# Patient Record
Sex: Female | Born: 2000 | Race: White | Hispanic: No | Smoking: Never smoker
Health system: Southern US, Community
[De-identification: ages and names within clinical notes are randomized; demographics above are authoritative.]

## PROBLEM LIST (undated history)

## (undated) DIAGNOSIS — J45909 Unspecified asthma, uncomplicated: Secondary | ICD-10-CM

## (undated) DIAGNOSIS — T7840XA Allergy, unspecified, initial encounter: Secondary | ICD-10-CM

## (undated) DIAGNOSIS — L309 Dermatitis, unspecified: Secondary | ICD-10-CM

## (undated) DIAGNOSIS — L709 Acne, unspecified: Secondary | ICD-10-CM

## (undated) HISTORY — DX: Unspecified asthma, uncomplicated: J45.909

## (undated) HISTORY — DX: Allergy, unspecified, initial encounter: T78.40XA

## (undated) HISTORY — DX: Dermatitis, unspecified: L30.9

---
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
## 2022-01-03 DIAGNOSIS — H5213 Myopia, bilateral: Secondary | ICD-10-CM | POA: Diagnosis not present

## 2022-01-12 ENCOUNTER — Encounter: Payer: Self-pay | Admitting: Women's Health

## 2022-01-18 ENCOUNTER — Ambulatory Visit (INDEPENDENT_AMBULATORY_CARE_PROVIDER_SITE_OTHER): Payer: Medicaid Other | Admitting: Women's Health

## 2022-01-18 ENCOUNTER — Encounter: Payer: Self-pay | Admitting: Women's Health

## 2022-01-18 VITALS — BP 116/80 | HR 103 | Wt 117.0 lb

## 2022-01-18 DIAGNOSIS — Z3041 Encounter for surveillance of contraceptive pills: Secondary | ICD-10-CM | POA: Diagnosis not present

## 2022-01-18 NOTE — Progress Notes (Signed)
   GYN VISIT Patient name: Andrea Kidd MRN [id number]  Date of birth: 03-02-2001 Chief Complaint:   Follow-up  History of Present Illness:   Andrea Kidd is a 21 y.o. G0P0000 female being seen today for f/u on nextstellis rx'd 3/20 for irregular periods. Doing well, had a week of brown spotting in May. No other issues.     Patient's last menstrual period was 12/06/2021 (exact date). The current method of family planning is OCP (estrogen/progesterone).  Last pap <21yo. Results were: N/A     10/18/2021    3:09 PM 09/28/2021    1:40 PM 09/15/2021   11:28 AM 07/12/2021   11:24 AM  Depression screen PHQ 2/9  Decreased Interest 0 0 0 0  Down, Depressed, Hopeless 0 0 0 0  PHQ - 2 Score 0 0 0 0  Altered sleeping 0   0  Tired, decreased energy 0   0  Change in appetite 0   0  Feeling bad or failure about yourself  0   0  Trouble concentrating 0   0  Moving slowly or fidgety/restless 0   0  Suicidal thoughts 0   0  PHQ-9 Score 0   0        10/18/2021    3:09 PM  GAD 7 : Generalized Anxiety Score  Nervous, Anxious, on Edge 0  Control/stop worrying 0  Worry too much - different things 0  Trouble relaxing 0  Restless 0  Easily annoyed or irritable 0  Afraid - awful might happen 0  Total GAD 7 Score 0     Review of Systems:   Pertinent items are noted in HPI Denies fever/chills, dizziness, headaches, visual disturbances, fatigue, shortness of breath, chest pain, abdominal pain, vomiting, abnormal vaginal discharge/itching/odor/irritation, problems with periods, bowel movements, urination, or intercourse unless otherwise stated above.  Pertinent History Reviewed:  Reviewed past medical,surgical, social, obstetrical and family history.  Reviewed problem list, medications and allergies. Physical Assessment:   Vitals:   01/18/22 0902  BP: 116/80  Pulse: (!) 103  Weight: 117 lb (53.1 kg)  Body mass index is 20.73 kg/m.       Physical Examination:   General  appearance: alert, well appearing, and in no distress  Mental status: alert, oriented to person, place, and time  Skin: warm & dry   Cardiovascular: normal heart rate noted  Respiratory: normal respiratory effort, no distress  Abdomen: soft, non-tender   Pelvic: examination not indicated  Extremities: no edema   Chaperone: N/A    No results found for this or any previous visit (from the past 24 hour(s)).  Assessment & Plan:  1) Contraception surveillance> doing well on Nextstellis, f/u in March to refill  Meds: No orders of the defined types were placed in this encounter.   No orders of the defined types were placed in this encounter.   Return for after 3/20 for med f/u.  Cheral Marker CNM, [facility] 01/18/2022 9:39 AM

## 2022-02-09 ENCOUNTER — Other Ambulatory Visit: Payer: Self-pay | Admitting: Internal Medicine

## 2022-02-09 DIAGNOSIS — M549 Dorsalgia, unspecified: Secondary | ICD-10-CM

## 2022-02-09 MED ORDER — IBUPROFEN 600 MG PO TABS: 600.0000 mg | ORAL_TABLET | Freq: Three times a day (TID) | ORAL | 0 refills | Status: DC | PRN

## 2022-03-14 ENCOUNTER — Ambulatory Visit
Admission: RE | Admit: 2022-03-14 | Discharge: 2022-03-14 | Disposition: A | Discharge status: Home / Self Care | Payer: Medicaid Other | Source: Ambulatory Visit | Attending: Nurse Practitioner | Admitting: Nurse Practitioner

## 2022-03-14 VITALS — BP 108/72 | HR 88 | Temp 98.8°F | Resp 19

## 2022-03-14 DIAGNOSIS — J309 Allergic rhinitis, unspecified: Secondary | ICD-10-CM | POA: Diagnosis not present

## 2022-03-14 MED ORDER — CETIRIZINE HCL 10 MG PO TABS: 10.0000 mg | ORAL_TABLET | Freq: Every day | ORAL | 0 refills | Status: DC

## 2022-03-14 MED ORDER — PSEUDOEPH-BROMPHEN-DM 30-2-10 MG/5ML PO SYRP: 5.0000 mL | ORAL_SOLUTION | Freq: Four times a day (QID) | ORAL | 0 refills | Status: DC | PRN

## 2022-03-14 NOTE — ED Provider Notes (Signed)
[facility]    CSN: [id number] Arrival date & time: 03/14/22  1415      History   Chief Complaint Chief Complaint  Patient presents with   Nasal Congestion    Running a low grade fever, very congested - Entered by patient   Appointment    HPI CLEOPATRA PINET is a 21 y.o. female.   The history is provided by the patient.   Patient presents for complaints of head congestion, nasal congestion, and cough that is been present for the past 2 days.  Patient states earlier this morning, she was running a fever of 101.  Patient states that she has not had chills, sore throat, headache, sinus pressure, wheezing, shortness of breath, or GI symptoms.  Patient states that she does have a history of seasonal allergies, takes levocetirizine.  She states that she does not feel that this medication is helping her allergy symptoms.  States that she does have Flonase at home, but does not use it regularly.  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  States there was a child that was sick last week with an ear infection.  States that she took Sudafed earlier this morning for her symptoms, feels symptoms have improved since taking the medication.  Declines COVID/flu testing today.  Past Medical History:  Diagnosis Date   Acne    Eczema 01/28/2013    Patient Active Problem List   Diagnosis Date Noted   Intermenstrual bleeding 09/15/2021   Menorrhagia with regular cycle 07/12/2021   Mild intermittent asthma, uncomplicated [id number]   Eczema 01/28/2013   Allergic rhinitis 01/28/2013    History reviewed. No pertinent surgical history.  OB History     Gravida  0   Para  0   Term  0   Preterm  0   AB  0   Living  0      SAB  0   IAB  0   Ectopic  0   Multiple  0   Live Births  0            Home Medications    Prior to Admission medications   Medication Sig Start Date End Date Taking? Authorizing Provider  cyclobenzaprine (FLEXERIL) 5 MG tablet  Take 1 tablet (5 mg total) by mouth 3 (three) times daily as needed for muscle spasms. 09/28/21   Lindell Spar, MD  Drospirenone-Estetrol (NEXTSTELLIS) 3-14.2 MG TABS Take 1 tablet by mouth daily. 10/18/21   Roma Schanz, CNM  ibuprofen (ADVIL) 600 MG tablet Take 1 tablet (600 mg total) by mouth every 8 (eight) hours as needed. 02/09/22   Lindell Spar, MD  levocetirizine (XYZAL) 5 MG tablet Take 1 tablet (5 mg total) by mouth every evening. 07/12/21   Lindell Spar, MD    Family History Family History  Problem Relation Age of Onset   Heart attack Maternal Grandfather    Healthy Father    Healthy Mother    Asthma Brother     Social History Social History   Tobacco Use   Smoking status: Never   Smokeless tobacco: Never  Vaping Use   Vaping Use: Never used  Substance Use Topics   Alcohol use: No   Drug use: No     Allergies   Bee pollen   Review of Systems Review of Systems Per HPI  Physical Exam Triage Vital Signs ED Triage Vitals  Enc Vitals Group     BP 03/14/22  1429 108/72     Pulse Rate 03/14/22 1429 88     Resp 03/14/22 1429 19     Temp 03/14/22 1429 98.8 F (37.1 C)     Temp src --      SpO2 03/14/22 1429 97 %     Weight --      Height --      Head Circumference --      Peak Flow --      Pain Score 03/14/22 1428 1     Pain Loc --      Pain Edu? --      Excl. in GC? --    No data found.  Updated Vital Signs BP 108/72   Pulse 88   Temp 98.8 F (37.1 C)   Resp 19   LMP 03/08/2022   SpO2 97%   Visual Acuity Right Eye Distance:   Left Eye Distance:   Bilateral Distance:    Right Eye Near:   Left Eye Near:    Bilateral Near:     Physical Exam Vitals and nursing note reviewed.  Constitutional:      General: She is not in acute distress.    Appearance: Normal appearance.  HENT:     Head: Normocephalic.     Right Ear: Tympanic membrane, ear canal and external ear normal.     Left Ear: Tympanic membrane, ear canal and external  ear normal.     Nose: Congestion present.     Right Turbinates: Enlarged and swollen.     Left Turbinates: Enlarged and swollen.     Right Sinus: No maxillary sinus tenderness or frontal sinus tenderness.     Left Sinus: No maxillary sinus tenderness or frontal sinus tenderness.     Mouth/Throat:     Lips: Pink.     Mouth: Mucous membranes are moist.     Pharynx: Oropharynx is clear. Uvula midline. No pharyngeal swelling, oropharyngeal exudate, posterior oropharyngeal erythema or uvula swelling.     Tonsils: No tonsillar exudate.  Eyes:     Extraocular Movements: Extraocular movements intact.     Conjunctiva/sclera: Conjunctivae normal.     Pupils: Pupils are equal, round, and reactive to light.  Cardiovascular:     Rate and Rhythm: Normal rate and regular rhythm.     Pulses: Normal pulses.     Heart sounds: Normal heart sounds.  Pulmonary:     Effort: Pulmonary effort is normal. No respiratory distress.     Breath sounds: Normal breath sounds. No wheezing or rales.  Abdominal:     General: Bowel sounds are normal.     Palpations: Abdomen is soft.  Musculoskeletal:     Cervical back: Normal range of motion.  Lymphadenopathy:     Cervical: No cervical adenopathy.  Skin:    General: Skin is warm and dry.  Neurological:     General: No focal deficit present.     Mental Status: She is alert and oriented to person, place, and time.  Psychiatric:        Mood and Affect: Mood normal.        Behavior: Behavior normal.      UC Treatments / Results  Labs (all labs ordered are listed, but only abnormal results are displayed) Labs Reviewed - No data to display  EKG   Radiology No results found.  Procedures Procedures (including critical care time)  Medications Ordered in UC Medications - No data to display  Initial Impression / Assessment and Plan /  UC Course  I have reviewed the triage vital signs and the nursing notes.  Pertinent labs & imaging results that were  available during my care of the patient were reviewed by me and considered in my medical decision making (see chart for details).  Patient presents with upper respiratory symptoms that been present for the past 2 days.  Patient's vital signs are stable, exam is reassuring at this time.  Differential diagnoses include allergic rhinitis versus viral upper respiratory infection.  Patient's symptoms have improved since taking Sudafed.  Will provide patient additional symptomatic relief with Bromfed and Zyrtec.  Recommended continuing use of Flonase.  Supportive care recommendations were provided to the patient.  Patient declines COVID/flu testing today.  Patient advised to follow-up in 7 to 10 days if symptoms do not improve. Final Clinical Impressions(s) / UC Diagnoses   Final diagnoses:  None   Discharge Instructions   None    ED Prescriptions   None    PDMP not reviewed this encounter.   Abran Cantor, NP 03/14/22 1446

## 2022-03-14 NOTE — ED Triage Notes (Signed)
Pt presents with complaints of head and nasal congestion x 2 days. Fever of 101. Pt has some relief with otc medication.

## 2022-03-14 NOTE — Discharge Instructions (Signed)
Take medication as prescribed.  Resume use of Flonase as discussed. Increase fluids and allow for plenty of rest. Recommend using a humidifier at bedtime during sleep to help with cough and nasal congestion. Sleep elevated on 2 pillows for worsening cough and nasal congestion.. Follow-up in 7 to 10 days if your symptoms do not improve.

## 2022-03-15 ENCOUNTER — Encounter: Payer: Self-pay | Admitting: Women's Health

## 2022-03-30 ENCOUNTER — Encounter: Payer: Self-pay | Admitting: Internal Medicine

## 2022-03-30 ENCOUNTER — Ambulatory Visit (INDEPENDENT_AMBULATORY_CARE_PROVIDER_SITE_OTHER): Payer: Medicaid Other | Admitting: Internal Medicine

## 2022-03-30 VITALS — BP 108/64 | HR 76 | Resp 18 | Ht 63.0 in | Wt 117.6 lb

## 2022-03-30 DIAGNOSIS — M2142 Flat foot [pes planus] (acquired), left foot: Secondary | ICD-10-CM | POA: Diagnosis not present

## 2022-03-30 DIAGNOSIS — M2141 Flat foot [pes planus] (acquired), right foot: Secondary | ICD-10-CM

## 2022-03-30 DIAGNOSIS — Z23 Encounter for immunization: Secondary | ICD-10-CM | POA: Diagnosis not present

## 2022-03-30 DIAGNOSIS — M545 Low back pain, unspecified: Secondary | ICD-10-CM | POA: Insufficient documentation

## 2022-03-30 DIAGNOSIS — G8929 Other chronic pain: Secondary | ICD-10-CM | POA: Diagnosis not present

## 2022-03-30 DIAGNOSIS — M2140 Flat foot [pes planus] (acquired), unspecified foot: Secondary | ICD-10-CM | POA: Insufficient documentation

## 2022-03-30 MED ORDER — IBUPROFEN 600 MG PO TABS: 600.0000 mg | ORAL_TABLET | Freq: Three times a day (TID) | ORAL | 0 refills | Status: DC | PRN

## 2022-03-30 NOTE — Patient Instructions (Signed)
Please take Ibuprofen as needed for back pain.  Please avoid heavy lifting and frequent bending.

## 2022-03-30 NOTE — Progress Notes (Signed)
Acute Office Visit  Subjective:    Patient ID: Andrea Kidd, female    DOB: 22-Jun-2001, 21 y.o.   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  She denies any recent fall or injury.  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  She denies any numbness or tingling of the LE.  She was told of flatfeet by a podiatrist, and she has tried different shoes to help with flatfeet.  She reports mild improvement in her back pain with those shoes.  She requests a referral to Podiatry.   Past Medical History:  Diagnosis Date   Acne    Eczema 01/28/2013    History reviewed. No pertinent surgical history.  Family History  Problem Relation Age of Onset   Heart attack Maternal Grandfather    Healthy Father    Healthy Mother    Asthma Brother     Social History   Socioeconomic History   Marital status: Single    Spouse name: Not on file   Number of children: Not on file   Years of education: Not on file   Highest education level: Not on file  Occupational History   Not on file  Tobacco Use   Smoking status: Never   Smokeless tobacco: Never  Vaping Use   Vaping Use: Never used  Substance and Sexual Activity   Alcohol use: No   Drug use: No   Sexual activity: Yes    Birth control/protection: Condom  Other Topics Concern   Not on file  Social History Narrative   Lives with Mom, Dad and older sister and younger brother. No smokers in the house.    Social Determinants of Health   Financial Resource Strain: Low Risk  (10/18/2021)   Overall Financial  Resource Strain (CARDIA)    Difficulty of Paying Living Expenses: Not hard at all  Food Insecurity: No Food Insecurity (10/18/2021)   Hunger Vital Sign    Worried About Running Out of Food in the Last Year: Never true    Ran Out of Food in the Last Year: Never true  Transportation Needs: No Transportation Needs (10/18/2021)   PRAPARE - Administrator, Civil Service (Medical): No    Lack of Transportation (Non-Medical): No  Physical Activity: Insufficiently Active (10/18/2021)   Exercise Vital Sign    Days of Exercise per Week: 1 day    Minutes of Exercise per Session: 30 min  Stress: No Stress Concern Present (10/18/2021)   [employer] of Occupational Health - Occupational Stress Questionnaire    Feeling of Stress : Only a little  Social Connections: Moderately Isolated (10/18/2021)   Social Connection and Isolation Panel [NHANES]    Frequency of Communication with Friends and Family: More than three times a week    Frequency of Social Gatherings with Friends and Family: More than three times a week    Attends Religious Services: Never    Database administrator or Organizations: No    Attends [employer]  or Organization Meetings: Never    Marital Status: Living with partner  Intimate Partner Violence: Not At Risk (10/18/2021)   Humiliation, Afraid, Rape, and Kick questionnaire    Fear of Current or Ex-Partner: No    Emotionally Abused: No    Physically Abused: No    Sexually Abused: No    Outpatient Medications Prior to Visit  Medication Sig Dispense Refill   cetirizine (ZYRTEC) 10 MG tablet Take 1 tablet (10 mg total) by mouth daily. 30 tablet 0   Drospirenone-Estetrol (NEXTSTELLIS) 3-14.2 MG TABS Take 1 tablet by mouth daily. 90 tablet 3   levocetirizine (XYZAL) 5 MG tablet Take 1 tablet (5 mg total) by mouth every evening. 90 tablet 1   ibuprofen (ADVIL) 600 MG tablet Take 1 tablet (600 mg total) by mouth every 8 (eight) hours as needed. 30 tablet 0    brompheniramine-pseudoephedrine-DM 30-2-10 MG/5ML syrup Take 5 mLs by mouth 4 (four) times daily as needed. (Patient not taking: Reported on 03/30/2022) 140 mL 0   cyclobenzaprine (FLEXERIL) 5 MG tablet Take 1 tablet (5 mg total) by mouth 3 (three) times daily as needed for muscle spasms. (Patient not taking: Reported on 03/30/2022) 30 tablet 1   No facility-administered medications prior to visit.    Allergies  Allergen Reactions   Bee Pollen     Review of Systems  Constitutional:  Negative for chills and fever.  HENT:  Negative for postnasal drip, sinus pressure and sore throat.   Respiratory:  Negative for cough and shortness of breath.   Cardiovascular:  Negative for chest pain and palpitations.  Genitourinary:  Positive for menstrual problem. Negative for dysuria and hematuria.  Musculoskeletal:  Positive for back pain. Negative for neck pain and neck stiffness.  Skin:  Negative for rash.  Neurological:  Negative for dizziness and weakness.  Psychiatric/Behavioral:  Negative for agitation and behavioral problems.        Objective:    Physical Exam Vitals reviewed.  Constitutional:      General: She is not in acute distress.    Appearance: She is not diaphoretic.  HENT:     Head: Normocephalic and atraumatic.     Nose: Nose normal.     Mouth/Throat:     Mouth: Mucous membranes are moist.     Pharynx: No oropharyngeal exudate.  Eyes:     General: No scleral icterus.    Extraocular Movements: Extraocular movements intact.  Cardiovascular:     Rate and Rhythm: Normal rate and regular rhythm.     Heart sounds: Normal heart sounds. No murmur heard. Pulmonary:     Breath sounds: Normal breath sounds. No wheezing or rales.  Musculoskeletal:        General: Tenderness (Mild, lumbar spine area) present.     Cervical back: Neck supple. No tenderness.     Right lower leg: No edema.     Left lower leg: No edema.  Skin:    General: Skin is warm.     Findings: No rash.   Neurological:     General: No focal deficit present.     Mental Status: She is alert and oriented to person, place, and time.     Sensory: No sensory deficit.     Motor: No weakness.  Psychiatric:        Mood and Affect: Mood normal.        Behavior: Behavior normal.     BP 108/64 (BP Location: Right Arm, Patient Position: Sitting, Cuff Size: Normal)  Pulse 76   Resp 18   Ht 5\' 3"  (1.6 m)   Wt 117 lb 9.6 oz (53.3 kg)   LMP 03/08/2022   SpO2 99%   BMI 20.83 kg/m  Wt Readings from Last 3 Encounters:  03/30/22 117 lb 9.6 oz (53.3 kg) (32 %, Z= -0.47)*  01/18/22 117 lb (53.1 kg) (31 %, Z= -0.48)*  10/18/21 119 lb (54 kg) (37 %, Z= -0.34)*   * Growth percentiles are based on CDC (Girls, 2-20 Years) data.        Assessment & Plan:   Problem List Items Addressed This Visit       Other   Chronic bilateral low back pain without sciatica - Primary    Unclear etiology Avoid heavy lifting and frequent bending Ibuprofen as needed for mild to moderate back pain Check x-ray of lumbar spine Referred to PT Simple back exercises material provided      Relevant Medications   ibuprofen (ADVIL) 600 MG tablet   Other Relevant Orders   Ambulatory referral to Physical Therapy   DG Lumbar Spine Complete   Flat foot    Has slightly lower curvature of feet Referred to Podiatry      Relevant Orders   Ambulatory referral to Podiatry   Other Visit Diagnoses     Need for immunization against influenza       Relevant Orders   Flu Vaccine QUAD 47mo+IM (Fluarix, Fluzone & Alfiuria Quad PF) (Completed)        Meds ordered this encounter  Medications   ibuprofen (ADVIL) 600 MG tablet    Sig: Take 1 tablet (600 mg total) by mouth every 8 (eight) hours as needed.    Dispense:  30 tablet    Refill:  0     Lolita Faulds 5mo, MD

## 2022-03-30 NOTE — Assessment & Plan Note (Signed)
Unclear etiology Avoid heavy lifting and frequent bending Ibuprofen as needed for mild to moderate back pain Check x-ray of lumbar spine Referred to PT Simple back exercises material provided

## 2022-03-30 NOTE — Assessment & Plan Note (Signed)
Has slightly lower curvature of feet Referred to Podiatry

## 2022-05-03 ENCOUNTER — Ambulatory Visit (HOSPITAL_COMMUNITY): Payer: Medicaid Other

## 2022-05-03 ENCOUNTER — Ambulatory Visit (INDEPENDENT_AMBULATORY_CARE_PROVIDER_SITE_OTHER): Payer: Medicaid Other | Admitting: Podiatry

## 2022-05-03 ENCOUNTER — Encounter: Payer: Self-pay | Admitting: Podiatry

## 2022-05-03 ENCOUNTER — Ambulatory Visit: Payer: Medicaid Other

## 2022-05-03 DIAGNOSIS — M2141 Flat foot [pes planus] (acquired), right foot: Secondary | ICD-10-CM

## 2022-05-03 DIAGNOSIS — M76821 Posterior tibial tendinitis, right leg: Secondary | ICD-10-CM | POA: Diagnosis not present

## 2022-05-03 DIAGNOSIS — M21169 Varus deformity, not elsewhere classified, unspecified knee: Secondary | ICD-10-CM

## 2022-05-03 DIAGNOSIS — M2142 Flat foot [pes planus] (acquired), left foot: Secondary | ICD-10-CM | POA: Diagnosis not present

## 2022-05-03 NOTE — Patient Instructions (Signed)
Posterior Tibial Tendinitis  Posterior tibial tendinitis is irritation of a tendon called the posterior tibial tendon. Your posterior tibial tendon is a cord-like tissue that connects bones of your lower leg and foot to a muscle that: Supports your arch. Helps you raise up on your toes. Helps you turn your foot down and in. This condition causes foot and ankle pain. It can also lead to a flat foot. What are the causes? This condition is most often caused by repeated stress to the tendon (overuse injury). It can also be caused by a sudden injury that stresses the tendon, such as landing on your foot after jumping or falling. What increases the risk? This condition is more likely to develop in: People who play a sport that involves putting a lot of pressure on the feet, such as: Basketball. Tennis. Soccer. Hockey. Runners. Females who are older than 19 years of age and are overweight. People with diabetes. People with decreased foot stability. People with flat feet. What are the signs or symptoms? Symptoms include: Pain in the inner ankle. Pain at the arch of your foot. Pain that gets worse with running, walking, or standing. Swelling on the inside of your ankle and foot. Weakness in your ankle or foot. Inability to stand up on tiptoe. Flattening of the arch of your foot. How is this diagnosed? This condition may be diagnosed based on: Your symptoms. Your medical history. A physical exam. Tests, such as: X-ray. MRI. Ultrasound. How is this treated? This condition may be treated by: Putting ice to the injured area. Taking NSAIDs, such as ibuprofen, to reduce pain and swelling. Wearing a special shoe or shoe insert to support your arch (orthotic). Having physical therapy. Replacing high-impact exercise with low-impact exercise, such as swimming or cycling. If your symptoms do not improve with these treatments, you may need to wear a splint, removable walking boot, or short  leg cast for 6-8 weeks to keep your foot and ankle still (immobilized). Follow these instructions at home: If you have a cast, splint, or boot: Keep it clean and dry. Check the skin around it every day. Tell your health care provider about any concerns. If you have a cast: Do not stick anything inside it to scratch your skin. Doing that increases your risk of infection. You may put lotion on dry skin around the edges of the cast. Do not put lotion on the skin underneath the cast. If you have a splint or boot: Wear it as told by your health care provider. Remove it only as told by your health care provider. Loosen it if your toes tingle, become numb, or turn cold and blue. Bathing Do not take baths, swim, or use a hot tub until your health care provider approves. Ask your health care provider if you may take showers. If your cast, splint, or boot is not waterproof: Do not let it get wet. Cover it with a waterproof covering while you take a bath or a shower. Managing pain and swelling   If directed, put ice on the injured area. If you have a removable splint or boot, remove it as told by your health care provider. Put ice in a plastic bag. Place a towel between your skin and the bag or between your cast and the bag. Leave the ice on for 20 minutes, 2-3 times a day. Move your toes often to reduce stiffness and swelling. Raise (elevate) the injured area above the level of your heart while you are sitting   or lying down. Activity Do not use the injured foot to support your body weight until your health care provider says that you can. Use crutches as told by your health care provider. Do not do activities that make pain or swelling worse. Ask your health care provider when it is safe to drive if you have a cast, splint, or boot on your foot. Return to your normal activities as told by your health care provider. Ask your health care provider what activities are safe for you. Do exercises as  told by your health care provider. General instructions Take over-the-counter and prescription medicines only as told by your health care provider. If you have an orthotic, use it as told by your health care provider. Keep all follow-up visits as told by your health care provider. This is important. How is this prevented? Wear footwear that is appropriate to your athletic activity. Avoid athletic activities that cause pain or swelling in your ankle or foot. Before being active, do range-of-motion and stretching exercises. If you develop pain or swelling while training, stop training. If you have pain or swelling that does not improve after a few days of rest, see your health care provider. If you start a new athletic activity, start gradually so you can build up your strength and flexibility. Contact a health care provider if: Your symptoms get worse. Your symptoms do not improve in 6-8 weeks. You develop new, unexplained symptoms. Your splint, boot, or cast gets damaged. Summary Posterior tibial tendinitis is irritation of a tendon called the posterior tibial tendon. This condition is most often caused by repeated stress to the tendon (overuse injury). This condition causes foot pain and ankle pain. It can also lead to a flat foot. This condition may be treated by not doing high-impact activities, applying ice, having physical therapy, wearing orthotics, and wearing a cast, splint, or boot if needed. This information is not intended to replace advice given to you by your health care provider. Make sure you discuss any questions you have with your health care provider. Document Revised: 11/13/2018 Document Reviewed: 09/20/2018 Elsevier Patient Education  2020 [employer]  Posterior Tibial Tendinitis Rehab Ask your health care provider which exercises are safe for you. Do exercises exactly as told by your health care provider and adjust them as directed. It is normal to feel mild  stretching, pulling, tightness, or discomfort as you do these exercises. Stop right away if you feel sudden pain or your pain gets worse. Do not begin these exercises until told by your health care provider. Stretching and range-of-motion exercises These exercises warm up your muscles and joints and improve the movement and flexibility in your ankle and foot. These exercises may also help to relieve pain. Standing wall calf stretch, knee straight   Stand with your hands against a wall. Extend your left / right leg behind you, and bend your front knee slightly. If directed, place a folded washcloth under the arch of your foot for support. Point the toes of your back foot slightly inward. Keeping your heels on the floor and your back knee straight, shift your weight toward the wall. Do not allow your back to arch. You should feel a gentle stretch in your upper left / right calf. Hold this position for 10 seconds. Repeat 10 times. Complete this exercise 2 times a day. Standing wall calf stretch, knee bent Stand with your hands against a wall. Extend your left / right leg behind you, and bend your front   knee slightly. If directed, place a folded washcloth under the arch of your foot for support. Point the toes of your back foot slightly inward. Unlock your back knee so it is bent. Keep your heels on the floor. You should feel a gentle stretch deep in your lower left / right calf. Hold this position for 10 seconds. Repeat 10 times. Complete this exercise 2 times a day. Strengthening exercises These exercises build strength and endurance in your ankle and foot. Endurance is the ability to use your muscles for a long time, even after they get tired. Ankle inversion with band Secure one end of a rubber exercise band or tubing to a fixed object, such as a table leg or a pole, that will stay still when the band is pulled. Loop the other end of the band around the middle of your left / right foot. Sit  on the floor facing the object with your left / right leg extended. The band or tube should be slightly tense when your foot is relaxed. Leading with your big toe, slowly bring your left / right foot and ankle inward, toward your other foot (inversion). Hold this position for 10 seconds. Slowly return your foot to the starting position. Repeat 10 times. Complete this exercise 2 times a day. Towel curls   Sit in a chair on a non-carpeted surface, and put your feet on the floor. Place a towel in front of your feet. Keeping your heel on the floor, put your left / right foot on the towel. Pull the towel toward you by grabbing the towel with your toes and curling them under. Keep your heel on the floor while you do this. Let your toes relax. Grab the towel with your toes again. Keep going until the towel is completely underneath your foot. Repeat 10 times. Complete this exercise 2 times a day. Balance exercise This exercise improves or maintains your balance. Balance is important in preventing falls. Single leg stand Without wearing shoes, stand near a railing or in a doorway. You may hold on to the railing or door frame as needed for balance. Stand on your left / right foot. Keep your big toe down on the floor and try to keep your arch lifted. If balancing in this position is too easy, try the exercise with your eyes closed or while standing on a pillow. Hold this position for 10 seconds. Repeat 10 times. Complete this exercise 2 times a day. This information is not intended to replace advice given to you by your health care provider. Make sure you discuss any questions you have with your health care provider.  

## 2022-05-08 NOTE — Progress Notes (Signed)
  Subjective:  Patient ID: Charlotta Newton, female    DOB: 08-Sep-2000,  MRN: [id number]  Chief Complaint  Patient presents with   Flat Foot    NP  bilateral flat feet    21 y.o. female presents with the above complaint. History confirmed with patient.   Objective:  Physical Exam: warm, good capillary refill, no trophic changes or ulcerative lesions, normal DP and PT pulses, normal sensory exam, and flexible bilateral pes planus deformity, collapse of medial longitudinal arch.  5 out of 5 strength.  Mild pain PT tendon, no strength deficits Assessment:   1. Pes planus of both feet   2. Genu varum, unspecified laterality   3. Posterior tibial tendinitis of right leg      Plan:  Patient was evaluated and treated and all questions answered.  Discussed the etiology and treatment options for posterior tibial tendinitis and pes planus deformity including stretching, formal physical therapy with an eccentric exercises therapy plan, supportive shoegears such as a running shoe or sneaker, bracing, topical and oral medications.  We also discussed that I do not routinely perform injections in this area because of the risk of an increased damage or rupture of the tendon.  We also discussed the role of surgical treatment of this for patients who do not improve after exhausting non-surgical treatment options.  Home physical therapy plan was given.  Discussed [facility] call for inflammatory conditions.  Rx for custom molded orthoses for [facility] was written for her today.  She will return as needed if it does not improve or worsens   Return if symptoms worsen or fail to improve.

## 2022-05-10 ENCOUNTER — Ambulatory Visit (HOSPITAL_COMMUNITY): Payer: Medicaid Other | Admitting: Physical Therapy

## 2022-05-22 ENCOUNTER — Encounter: Payer: Self-pay | Admitting: Women's Health

## 2022-05-31 DIAGNOSIS — M2141 Flat foot [pes planus] (acquired), right foot: Secondary | ICD-10-CM | POA: Diagnosis not present

## 2022-05-31 DIAGNOSIS — M2142 Flat foot [pes planus] (acquired), left foot: Secondary | ICD-10-CM | POA: Diagnosis not present

## 2022-06-02 ENCOUNTER — Ambulatory Visit (INDEPENDENT_AMBULATORY_CARE_PROVIDER_SITE_OTHER): Payer: Medicaid Other | Admitting: Adult Health

## 2022-06-02 ENCOUNTER — Encounter: Payer: Self-pay | Admitting: Adult Health

## 2022-06-02 ENCOUNTER — Other Ambulatory Visit (HOSPITAL_COMMUNITY)
Admission: RE | Admit: 2022-06-02 | Discharge: 2022-06-02 | Disposition: A | Discharge status: Home / Self Care | Payer: Medicaid Other | Source: Ambulatory Visit | Attending: Adult Health | Admitting: Adult Health

## 2022-06-02 VITALS — BP 117/81 | HR 88 | Ht 63.0 in | Wt 116.0 lb

## 2022-06-02 DIAGNOSIS — Z113 Encounter for screening for infections with a predominantly sexual mode of transmission: Secondary | ICD-10-CM | POA: Insufficient documentation

## 2022-06-02 DIAGNOSIS — N923 Ovulation bleeding: Secondary | ICD-10-CM | POA: Diagnosis not present

## 2022-06-02 DIAGNOSIS — Z3202 Encounter for pregnancy test, result negative: Secondary | ICD-10-CM | POA: Diagnosis not present

## 2022-06-02 DIAGNOSIS — N898 Other specified noninflammatory disorders of vagina: Secondary | ICD-10-CM | POA: Insufficient documentation

## 2022-06-02 LAB — POCT URINE PREGNANCY: Preg Test, Ur: NEGATIVE

## 2022-06-02 NOTE — Progress Notes (Signed)
  Subjective:     Patient ID: Andrea Kidd, female   DOB: Jul 02, 2001, 21 y.o.   MRN: [id number]  HPI Andrea Kidd is a 21 year old female, single, G0P0, in complaining of brownish red discharge, no itching or burning, some cramps from 05/22/22 till started period yesterday. Denies any missed or late pills and she really likes nextstellis.  PCP is Dr Posey Pronto.  Review of Systems Brownish red discharge,some cramps No itching or burning    Reviewed past medical,surgical, social and family history. Reviewed medications and allergies.   Objective:   Physical Exam BP 117/81 (BP Location: Left Arm, Patient Position: Sitting, Cuff Size: Normal)   Pulse 88   Ht 5\' 3"  (1.6 m)   Wt 116 lb (52.6 kg)   LMP 06/01/2022   BMI 20.55 kg/m  UPT is negative.   Skin warm and dry.Pelvic: external genitalia is normal in appearance no lesions, vagina: period blood without odor,urethra has no lesions or masses noted, cervix:smooth,no CMT, uterus: normal size, shape and contour, non tender, no masses felt, adnexa: no masses or tenderness noted. Bladder is non tender and no masses felt.CV swab obtained.  [facility] - 06/02/22 0901       Pregnancy Intention Screening   Does the patient want to become pregnant in the next year? No    Does the patient's partner want to become pregnant in the next year? No    Would the patient like to discuss contraceptive options today? No      Contraception Wrap Up   Current Method Oral Contraceptive    End Method Oral Contraceptive            Examination chaperoned by Andrea Pupa LPN  Assessment:     1. Pregnancy examination or test, negative result - POCT urine pregnancy  2. Vaginal discharge CV swab sent  - Cervicovaginal ancillary only( [facility])  3. Screening examination for STD (sexually transmitted disease) CV swab sent for GC/CHL,trich,BV and yeast  - Cervicovaginal ancillary only( [facility])  4. Intermenstrual bleeding Continue Nextstellis  Keep  log, if happens again, let us know    Plan:     Follow up prn

## 2022-06-03 LAB — CERVICOVAGINAL ANCILLARY ONLY
Bacterial Vaginitis (gardnerella): POSITIVE — AB
Candida Glabrata: NEGATIVE
Candida Vaginitis: NEGATIVE
Chlamydia: NEGATIVE
Comment: NEGATIVE
Comment: NEGATIVE
Comment: NEGATIVE
Comment: NEGATIVE
Comment: NEGATIVE
Comment: NORMAL
Neisseria Gonorrhea: NEGATIVE
Trichomonas: NEGATIVE

## 2022-06-06 ENCOUNTER — Other Ambulatory Visit: Payer: Self-pay | Admitting: Adult Health

## 2022-06-06 MED ORDER — METRONIDAZOLE 500 MG PO TABS: 500.0000 mg | ORAL_TABLET | Freq: Two times a day (BID) | ORAL | 0 refills | Status: DC

## 2022-06-06 NOTE — Progress Notes (Signed)
+  BV on vaginal swab, rx sent for flagyl, no sex or alcohol while taking. It is not STD, but overgrowth of bacteria in vagina due to change in [facility]

## 2022-07-12 ENCOUNTER — Encounter: Payer: Medicaid Other | Admitting: Internal Medicine

## 2022-07-26 ENCOUNTER — Other Ambulatory Visit: Payer: Self-pay | Admitting: Internal Medicine

## 2022-07-26 DIAGNOSIS — M545 Low back pain, unspecified: Secondary | ICD-10-CM

## 2022-08-09 ENCOUNTER — Encounter: Payer: Medicaid Other | Admitting: Internal Medicine

## 2022-08-17 ENCOUNTER — Encounter: Payer: Self-pay | Admitting: Women's Health

## 2022-08-18 ENCOUNTER — Telehealth: Payer: Self-pay | Admitting: *Deleted

## 2022-08-18 NOTE — Telephone Encounter (Signed)
2 sample boxes of Nextstellis given. Lot # [id number] exp 05/2023. [place]

## 2022-08-23 ENCOUNTER — Encounter: Payer: Self-pay | Admitting: Internal Medicine

## 2022-08-23 ENCOUNTER — Ambulatory Visit (INDEPENDENT_AMBULATORY_CARE_PROVIDER_SITE_OTHER): Payer: Medicaid Other | Admitting: Internal Medicine

## 2022-08-23 VITALS — BP 130/82 | HR 77 | Ht 63.0 in | Wt 114.6 lb

## 2022-08-23 DIAGNOSIS — M2141 Flat foot [pes planus] (acquired), right foot: Secondary | ICD-10-CM

## 2022-08-23 DIAGNOSIS — Z114 Encounter for screening for human immunodeficiency virus [HIV]: Secondary | ICD-10-CM | POA: Diagnosis not present

## 2022-08-23 DIAGNOSIS — L309 Dermatitis, unspecified: Secondary | ICD-10-CM

## 2022-08-23 DIAGNOSIS — Z0001 Encounter for general adult medical examination with abnormal findings: Secondary | ICD-10-CM | POA: Diagnosis not present

## 2022-08-23 DIAGNOSIS — Z1159 Encounter for screening for other viral diseases: Secondary | ICD-10-CM | POA: Diagnosis not present

## 2022-08-23 DIAGNOSIS — J309 Allergic rhinitis, unspecified: Secondary | ICD-10-CM

## 2022-08-23 DIAGNOSIS — M2142 Flat foot [pes planus] (acquired), left foot: Secondary | ICD-10-CM | POA: Diagnosis not present

## 2022-08-23 MED ORDER — TRIAMCINOLONE ACETONIDE 0.1 % EX CREA: 1.0000 | TOPICAL_CREAM | Freq: Two times a day (BID) | CUTANEOUS | 2 refills | Status: DC

## 2022-08-23 NOTE — Assessment & Plan Note (Signed)
Well controlled with Zyrtec Was on Singulair in the past Flonase as needed

## 2022-08-23 NOTE — Patient Instructions (Signed)
Please apply Kenalog cream over rash.  Continue taking Zyrtec as needed for allergies.

## 2022-08-23 NOTE — Progress Notes (Signed)
Established Patient Office Visit  Subjective:  Patient ID: Andrea Kidd, female    DOB: 2000-09-28  Age: 22 y.o. MRN: [id number]  CC:  Chief Complaint  Patient presents with   Annual Exam    HPI RAEANNA Kidd is a 22 y.o. female with past medical history of allergic rhinitis, asthma and eczema who presents for annual physical.  She reports improvement in her menstrual bleeding with OCPs now.  Denies any vaginal discharge.  Her foot pain also has improved with orthotic shoe insert.  She reports recent worsening of rash over thigh area.  She has history of eczema and used to apply steroid cream over it.    Past Medical History:  Diagnosis Date   Acne    Eczema 01/28/2013    History reviewed. No pertinent surgical history.  Family History  Problem Relation Age of Onset   Heart attack Maternal Grandfather    Healthy Father    Healthy Mother    Asthma Brother     Social History   Socioeconomic History   Marital status: Single    Spouse name: Not on file   Number of children: Not on file   Years of education: Not on file   Highest education level: Not on file  Occupational History   Not on file  Tobacco Use   Smoking status: Never   Smokeless tobacco: Never  Vaping Use   Vaping Use: Never used  Substance and Sexual Activity   Alcohol use: No   Drug use: No   Sexual activity: Yes    Birth control/protection: Pill  Other Topics Concern   Not on file  Social History Narrative   Lives with Mom, Dad and older sister and younger brother. No smokers in the house.    Social Determinants of Health   Financial Resource Strain: Low Risk  (10/18/2021)   Overall Financial Resource Strain (CARDIA)    Difficulty of Paying Living Expenses: Not hard at all  Food Insecurity: No Food Insecurity (10/18/2021)   Hunger Vital Sign    Worried About Running Out of Food in the Last Year: Never true    Ran Out of Food in the Last Year: Never true  Transportation Needs:  No Transportation Needs (10/18/2021)   PRAPARE - Administrator, Civil Service (Medical): No    Lack of Transportation (Non-Medical): No  Physical Activity: Insufficiently Active (10/18/2021)   Exercise Vital Sign    Days of Exercise per Week: 1 day    Minutes of Exercise per Session: 30 min  Stress: No Stress Concern Present (10/18/2021)   [employer] of Occupational Health - Occupational Stress Questionnaire    Feeling of Stress : Only a little  Social Connections: Moderately Isolated (10/18/2021)   Social Connection and Isolation Panel [NHANES]    Frequency of Communication with Friends and Family: More than three times a week    Frequency of Social Gatherings with Friends and Family: More than three times a week    Attends Religious Services: Never    Database administrator or Organizations: No    Attends Banker Meetings: Never    Marital Status: Living with partner  Intimate Partner Violence: Not At Risk (10/18/2021)   Humiliation, Afraid, Rape, and Kick questionnaire    Fear of Current or Ex-Partner: No    Emotionally Abused: No    Physically Abused: No    Sexually Abused: No    Outpatient Medications Prior to Visit  Medication Sig Dispense Refill   cetirizine (ZYRTEC) 10 MG tablet Take 1 tablet (10 mg total) by mouth daily. (Patient taking differently: Take 10 mg by mouth.) 30 tablet 0   Drospirenone-Estetrol (NEXTSTELLIS) 3-14.2 MG TABS Take 1 tablet by mouth daily. 90 tablet 3   ibuprofen (ADVIL) 600 MG tablet TAKE 1 TABLET(600 MG) BY MOUTH EVERY 8 HOURS AS NEEDED 30 tablet 0   metroNIDAZOLE (FLAGYL) 500 MG tablet Take 1 tablet (500 mg total) by mouth 2 (two) times daily. 14 tablet 0   No facility-administered medications prior to visit.    Allergies  Allergen Reactions   Bee Pollen     ROS Review of Systems  Constitutional:  Negative for chills and fever.  HENT:  Negative for postnasal drip, sinus pressure and sore throat.    Respiratory:  Negative for cough and shortness of breath.   Cardiovascular:  Negative for chest pain and palpitations.  Genitourinary:  Negative for dysuria and hematuria.  Musculoskeletal:  Negative for neck pain and neck stiffness.  Skin:  Positive for rash.  Neurological:  Negative for dizziness and weakness.  Psychiatric/Behavioral:  Negative for agitation and behavioral problems.       Objective:    Physical Exam Vitals reviewed.  Constitutional:      General: She is not in acute distress.    Appearance: She is not diaphoretic.  HENT:     Head: Normocephalic and atraumatic.     Nose: Nose normal.     Mouth/Throat:     Mouth: Mucous membranes are moist.     Pharynx: No oropharyngeal exudate.  Eyes:     General: No scleral icterus.    Extraocular Movements: Extraocular movements intact.  Cardiovascular:     Rate and Rhythm: Normal rate and regular rhythm.     Heart sounds: Normal heart sounds. No murmur heard. Pulmonary:     Breath sounds: Normal breath sounds. No wheezing or rales.  Musculoskeletal:     Cervical back: Neck supple. No tenderness.     Right lower leg: No edema.     Left lower leg: No edema.  Skin:    General: Skin is warm.     Findings: Rash (Erythematous patches over b/l thigh) present.  Neurological:     General: No focal deficit present.     Mental Status: She is alert and oriented to person, place, and time.     Sensory: No sensory deficit.     Motor: No weakness.  Psychiatric:        Mood and Affect: Mood normal.        Behavior: Behavior normal.     BP 130/82 (BP Location: Right Arm, Patient Position: Sitting, Cuff Size: Normal)   Pulse 77   Ht 5\' 3"  (1.6 m)   Wt 114 lb 9.6 oz (52 kg)   SpO2 98%   BMI 20.30 kg/m  Wt Readings from Last 3 Encounters:  08/23/22 114 lb 9.6 oz (52 kg) (24 %, Z= -0.70)*  06/02/22 116 lb (52.6 kg) (28 %, Z= -0.59)*  03/30/22 117 lb 9.6 oz (53.3 kg) (32 %, Z= -0.47)*   * Growth percentiles are based on  CDC (Girls, 2-20 Years) data.    Lab Results  Component Value Date   TSH 1.660 07/22/2021   Lab Results  Component Value Date   WBC 6.4 07/22/2021   HGB 14.2 07/22/2021   HCT 42.2 07/22/2021   MCV 86 07/22/2021   PLT 325 07/22/2021   Lab  Results  Component Value Date   NA 139 07/22/2021   K 4.2 07/22/2021   CO2 22 07/22/2021   GLUCOSE 84 07/22/2021   BUN 14 07/22/2021   CREATININE 0.72 07/22/2021   BILITOT 0.8 07/22/2021   ALKPHOS 74 07/22/2021   AST 18 07/22/2021   ALT 11 07/22/2021   PROT 6.9 07/22/2021   ALBUMIN 4.6 07/22/2021   CALCIUM 9.6 07/22/2021   EGFR 124 07/22/2021   No results found for: "CHOL" No results found for: "HDL" No results found for: "LDLCALC" No results found for: "TRIG" No results found for: "CHOLHDL" No results found for: "HGBA1C"    Assessment & Plan:   Problem List Items Addressed This Visit       Respiratory   Allergic rhinitis    Well controlled with Zyrtec Was on Singulair in the past Flonase as needed      Relevant Orders   CBC with Differential/Platelet   Basic Metabolic Panel (BMET)     Musculoskeletal and Integument   Eczema    Has recent worsening of rash over thigh area Kenalog cream prescribed      Relevant Medications   triamcinolone cream (KENALOG) 0.1 %   Other Relevant Orders   CBC with Differential/Platelet   Basic Metabolic Panel (BMET)     Other   Flat foot    Has slightly lower curvature of feet Referred to Podiatry - has orthotic shoe insert now      Encounter for general adult medical examination with abnormal findings - Primary    Physical exam as documented. Blood tests - CBC, BMP, HIV and Hep C screening today. Denies HPV vaccine for now.      Relevant Orders   CBC with Differential/Platelet   Basic Metabolic Panel (BMET)   Other Visit Diagnoses     Screening for HIV (human immunodeficiency virus)       Relevant Orders   HIV antibody (with reflex)   Need for hepatitis C screening  test       Relevant Orders   Hepatitis C Antibody       Meds ordered this encounter  Medications   triamcinolone cream (KENALOG) 0.1 %    Sig: Apply 1 Application topically 2 (two) times daily.    Dispense:  30 g    Refill:  2    Follow-up: Return in about 1 year (around 08/24/2023) for Annual physical.    Lindell Spar, MD

## 2022-08-23 NOTE — Assessment & Plan Note (Signed)
Has slightly lower curvature of feet Referred to Podiatry - has orthotic shoe insert now

## 2022-08-23 NOTE — Assessment & Plan Note (Signed)
Has recent worsening of rash over thigh area Kenalog cream prescribed

## 2022-08-23 NOTE — Assessment & Plan Note (Signed)
Physical exam as documented. Blood tests - CBC, BMP, HIV and Hep C screening today. Denies HPV vaccine for now.

## 2022-08-24 LAB — BASIC METABOLIC PANEL
BUN/Creatinine Ratio: 18 (ref 9–23)
BUN: 12 mg/dL (ref 6–20)
CO2: 20 mmol/L (ref 20–29)
Calcium: 9.7 mg/dL (ref 8.7–10.2)
Chloride: 106 mmol/L (ref 96–106)
Creatinine, Ser: 0.68 mg/dL (ref 0.57–1.00)
Glucose: 68 mg/dL — ABNORMAL LOW (ref 70–99)
Potassium: 4.1 mmol/L (ref 3.5–5.2)
Sodium: 142 mmol/L (ref 134–144)
eGFR: 129 mL/min/{1.73_m2} (ref >59)

## 2022-08-24 LAB — CBC WITH DIFFERENTIAL/PLATELET
Basophils Absolute: 0.1 10*3/uL (ref 0.0–0.2)
Basos: 1 %
EOS (ABSOLUTE): 0 10*3/uL (ref 0.0–0.4)
Eos: 1 %
Hematocrit: 43.1 % (ref 34.0–46.6)
Hemoglobin: 14.9 g/dL (ref 11.1–15.9)
Immature Grans (Abs): 0 10*3/uL (ref 0.0–0.1)
Immature Granulocytes: 0 %
Lymphocytes Absolute: 2.1 10*3/uL (ref 0.7–3.1)
Lymphs: 31 %
MCH: 29.4 pg (ref 26.6–33.0)
MCHC: 34.6 g/dL (ref 31.5–35.7)
MCV: 85 fL (ref 79–97)
Monocytes Absolute: 0.4 10*3/uL (ref 0.1–0.9)
Monocytes: 6 %
Neutrophils Absolute: 4 10*3/uL (ref 1.4–7.0)
Neutrophils: 61 %
Platelets: 314 10*3/uL (ref 150–450)
RBC: 5.06 x10E6/uL (ref 3.77–5.28)
RDW: 12.6 % (ref 11.7–15.4)
WBC: 6.6 10*3/uL (ref 3.4–10.8)

## 2022-08-24 LAB — HIV ANTIBODY (ROUTINE TESTING W REFLEX): HIV Screen 4th Generation wRfx: NONREACTIVE

## 2022-08-24 LAB — HEPATITIS C ANTIBODY: Hep C Virus Ab: NONREACTIVE

## 2022-09-05 ENCOUNTER — Encounter: Payer: Self-pay | Admitting: Internal Medicine

## 2022-09-05 ENCOUNTER — Telehealth: Payer: Medicaid Other | Admitting: Internal Medicine

## 2022-09-05 DIAGNOSIS — R200 Anesthesia of skin: Secondary | ICD-10-CM | POA: Insufficient documentation

## 2022-09-05 DIAGNOSIS — R202 Paresthesia of skin: Secondary | ICD-10-CM

## 2022-09-05 NOTE — Progress Notes (Signed)
Virtual Visit via Video Note   Because of [name]'s co-morbid illnesses, she is at least at moderate risk for complications without adequate follow up.  This format is felt to be most appropriate for this patient at this time.  All issues noted in this document were discussed and addressed.  A limited physical exam was performed with this format.      Evaluation Performed:  Follow-up visit  Date:  09/05/2022   ID:  Andrea Kidd, DOB 04-Sep-2000, MRN [id number]  Patient Location: Home Provider Location: Office/Clinic  Participants: Patient Location of Patient: Home Location of Provider: Telehealth Consent was obtain for visit to be over via telehealth. I verified that I am speaking with the correct person using two identifiers.  PCP:  Lindell Spar, MD   Chief Complaint: Numbness of right hand finger  History of Present Illness:    Andrea Kidd is a 22 y.o. female who has a video visit for numbness of the right middle finger, which is intermittent since 12/30/21.  She has tingling and shocklike sensation that radiates from the finger towards the arm when touched/hit hard.  She denies any wrist pain.  Of note, she had an injury of the right middle finger while at a manage, which caused detachment of the nail.  She had artificial nails placed in between, which made the skin irritated.  Her nail has grown back now.  She denies any swelling or discharge from the nail or around.  The patient does not have symptoms concerning for COVID-19 infection (fever, chills, cough, or new shortness of breath).   Past Medical, Surgical, Social History, Allergies, and Medications have been Reviewed.  Past Medical History:  Diagnosis Date   Acne    Eczema 01/28/2013   No past surgical history on file.   No outpatient medications have been marked as taking for the 09/05/22 encounter (Video Visit) with Lindell Spar, MD.     Allergies:   Bee pollen   ROS:   Please see  the history of present illness.     All other systems reviewed and are negative.   Labs/Other Tests and Data Reviewed:    Recent Labs: 08/23/2022: BUN 12; Creatinine, Ser 0.68; Hemoglobin 14.9; Platelets 314; Potassium 4.1; Sodium 142   Recent Lipid Panel No results found for: "CHOL", "TRIG", "HDL", "CHOLHDL", "LDLCALC", "LDLDIRECT"  Wt Readings from Last 3 Encounters:  08/23/22 114 lb 9.6 oz (52 kg) (24 %, Z= -0.70)*  06/02/22 116 lb (52.6 kg) (28 %, Z= -0.59)*  03/30/22 117 lb 9.6 oz (53.3 kg) (32 %, Z= -0.47)*   * Growth percentiles are based on CDC (Girls, 2-20 Years) data.     Objective:    Vital Signs:  There were no vitals taken for this visit.   VITAL SIGNS:  reviewed GEN:  no acute distress EYES:  sclerae anicteric, EOMI - Extraocular Movements Intact NEURO:  alert and oriented x 3, no obvious focal deficit PSYCH:  normal affect Right hand: Normal nailbed on right middle finger.  ASSESSMENT & PLAN:    Numbness and tingling of right hand Likely from the injury causing detachment of the nail She might have had some nerve injury in the finger around the nail Does not affect her daily functioning Reassured and observe for now   I discussed the assessment and treatment plan with the patient. The patient was provided an opportunity to ask questions, and all were answered. The patient agreed with  the plan and demonstrated an understanding of the instructions.   The patient was advised to call back or seek an in-person evaluation if the symptoms worsen or if the condition fails to improve as anticipated.  The above assessment and management plan was discussed with the patient. The patient verbalized understanding of and has agreed to the management plan.   Medication Adjustments/Labs and Tests Ordered: Current medicines are reviewed at length with the patient today.  Concerns regarding medicines are outlined above.   Tests Ordered: No orders of the defined types  were placed in this encounter.   Medication Changes: No orders of the defined types were placed in this encounter.    Note: This dictation was prepared with Dragon dictation along with smaller phrase technology. Similar sounding words can be transcribed inadequately or may not be corrected upon review. Any transcriptional errors that result from this process are unintentional.      Disposition:  Follow up  Signed, Lindell Spar, MD  09/05/2022 3:44 PM     [facility]

## 2022-09-05 NOTE — Assessment & Plan Note (Signed)
Likely from the injury causing detachment of the nail She might have had some nerve injury in the finger around the nail Does not affect her daily functioning Reassured and observe for now

## 2022-10-09 DIAGNOSIS — H5213 Myopia, bilateral: Secondary | ICD-10-CM | POA: Diagnosis not present

## 2022-10-17 ENCOUNTER — Encounter: Payer: Self-pay | Admitting: Internal Medicine

## 2022-10-18 ENCOUNTER — Other Ambulatory Visit: Payer: Self-pay

## 2022-10-18 MED ORDER — CETIRIZINE HCL 10 MG PO TABS: 10.0000 mg | ORAL_TABLET | Freq: Every day | ORAL | 0 refills | Status: DC

## 2022-10-31 ENCOUNTER — Other Ambulatory Visit: Payer: Self-pay | Admitting: *Deleted

## 2022-10-31 MED ORDER — NEXTSTELLIS 3-14.2 MG PO TABS: 1.0000 | ORAL_TABLET | Freq: Every day | ORAL | 3 refills | Status: DC

## 2022-11-15 ENCOUNTER — Encounter: Payer: Self-pay | Admitting: Adult Health

## 2022-11-15 ENCOUNTER — Other Ambulatory Visit: Payer: Self-pay | Admitting: Internal Medicine

## 2022-11-15 ENCOUNTER — Ambulatory Visit (INDEPENDENT_AMBULATORY_CARE_PROVIDER_SITE_OTHER): Payer: Medicaid Other | Admitting: Adult Health

## 2022-11-15 VITALS — BP 111/76 | HR 81 | Ht 63.0 in | Wt 114.0 lb

## 2022-11-15 DIAGNOSIS — Z113 Encounter for screening for infections with a predominantly sexual mode of transmission: Secondary | ICD-10-CM | POA: Diagnosis not present

## 2022-11-15 DIAGNOSIS — Z3041 Encounter for surveillance of contraceptive pills: Secondary | ICD-10-CM | POA: Diagnosis not present

## 2022-11-15 DIAGNOSIS — M545 Low back pain, unspecified: Secondary | ICD-10-CM

## 2022-11-15 MED ORDER — NEXTSTELLIS 3-14.2 MG PO TABS: 1.0000 | ORAL_TABLET | Freq: Every day | ORAL | 3 refills | Status: DC

## 2022-11-15 NOTE — Progress Notes (Signed)
  Subjective:     Patient ID: Andrea Kidd, female   DOB: 08/05/2000, 22 y.o.   MRN: [id number]  HPI Andrea Kidd is a 22 year old white female,single, G0P0, in to have birth control refilled. No complaints.  PCP is Dr Allena Katz  Review of Systems Patient denies any headaches, hearing loss, fatigue, blurred vision, shortness of breath, chest pain, abdominal pain, problems with bowel movements, urination, or intercourse. No joint pain or mood swings.  Periods good Reviewed past medical,surgical, social and family history. Reviewed medications and allergies.     Objective:   Physical Exam BP 111/76 (BP Location: Left Arm, Patient Position: Sitting, Cuff Size: Normal)   Pulse 81   Ht  (1.6 m)   Wt 114 lb (51.7 kg)   LMP 10/16/2022 (Approximate)   BMI 20.19 kg/m  Skin warm and dry. Neck: mid line trachea, normal thyroid, good ROM, no lymphadenopathy noted. Lungs: clear to ausculation bilaterally. Cardiovascular: regular rate and rhythm.  AA is 0 Fall risk is low    11/15/2022    8:37 AM 09/05/2022    3:00 PM 08/23/2022    1:51 PM  Depression screen PHQ 2/9  Decreased Interest 0 0 0  Down, Depressed, Hopeless 0 0 0  PHQ - 2 Score 0 0 0  Altered sleeping 0    Tired, decreased energy 0    Change in appetite 0    Feeling bad or failure about yourself  0    Trouble concentrating 0    Moving slowly or fidgety/restless 0    Suicidal thoughts 0    PHQ-9 Score 0         11/15/2022    8:38 AM 10/18/2021    3:09 PM  GAD 7 : Generalized Anxiety Score  Nervous, Anxious, on Edge 0 0  Control/stop worrying 0 0  Worry too much - different things 0 0  Trouble relaxing 0 0  Restless 0 0  Easily annoyed or irritable 0 0  Afraid - awful might happen 0 0  Total GAD 7 Score 0 0    Upstream - 11/15/22 0843       Pregnancy Intention Screening   Does the patient want to become pregnant in the next year? Unsure    Does the patient's partner want to become pregnant in the next year? Unsure     Would the patient like to discuss contraceptive options today? No      Contraception Wrap Up   Current Method Oral Contraceptive    End Method Oral Contraceptive    Contraception Counseling Provided No    How was the end contraceptive method provided? Prescription                  Assessment:     1. Screening examination for STD (sexually transmitted disease) Urine sent for GC/CHL  2. Encounter for surveillance of contraceptive pills She is happy with Nextstellis, 2 sample packs given to ensure no gap in taking pills  Meds ordered this encounter  Medications   Drospirenone-Estetrol (NEXTSTELLIS) 3-14.2 MG TABS    Sig: Take 1 tablet by mouth daily.    Dispense:  90 tablet    Refill:  3    Order Specific Question:   Supervising Provider    Answer:   Lazaro Arms [[id number]]       Plan:     Follow up in 1 year

## 2022-11-16 ENCOUNTER — Ambulatory Visit
Admission: RE | Admit: 2022-11-16 | Discharge: 2022-11-16 | Disposition: A | Discharge status: Home / Self Care | Payer: Medicaid Other | Source: Ambulatory Visit | Attending: Nurse Practitioner | Admitting: Nurse Practitioner

## 2022-11-16 VITALS — BP 127/84 | HR 98 | Temp 98.1°F | Resp 17

## 2022-11-16 DIAGNOSIS — U071 COVID-19: Secondary | ICD-10-CM | POA: Diagnosis not present

## 2022-11-16 MED ORDER — PAXLOVID (300/100) 20 X 150 MG & 10 X 100MG PO TBPK: 3.0000 | ORAL_TABLET | Freq: Two times a day (BID) | ORAL | 0 refills | Status: AC

## 2022-11-16 NOTE — ED Triage Notes (Addendum)
Pt c/o dry cough,headache,nasal congestion,body ache  Pt tested positive for COVID yesterday after exposure over the weekend.    Pt has taken ibuprofen last taken at 7:30 am

## 2022-11-16 NOTE — ED Provider Notes (Signed)
[facility]    CSN: [id number] Arrival date & time: 11/16/22  0845      History   Chief Complaint Chief Complaint  Patient presents with   Influenza    tested positive for covid. - Entered by patient    HPI Andrea Kidd is a 22 y.o. female.   The history is provided by the patient.   The patient presents after testing positive using a home COVID test 1 day ago.  Patient complains of bodyaches, headache, nasal congestion, and a dry cough.  She states that she has not had fever, chills, ear pain, wheezing, difficulty breathing, abdominal pain, nausea, vomiting, or diarrhea.  Patient states that several of her family members including her boyfriend and her sister's boyfriend have tested positive for COVID.  She states that she took ibuprofen yesterday.  Patient denies any history of kidney disease, states that she is generally healthy, denies being on medications such as statins.  Patient reports prior history of allergies for which she uses allergy nasal spray.    Past Medical History:  Diagnosis Date   Acne    Eczema 01/28/2013    Patient Active Problem List   Diagnosis Date Noted   Encounter for surveillance of contraceptive pills 11/15/2022   Screening examination for STD (sexually transmitted disease) 11/15/2022   Numbness and tingling of right hand 09/05/2022   Encounter for general adult medical examination with abnormal findings 08/23/2022   Chronic bilateral low back pain without sciatica 03/30/2022   Flat foot 03/30/2022   Intermenstrual bleeding 09/15/2021   Menorrhagia with regular cycle 07/12/2021   Mild intermittent asthma, uncomplicated 08/02/2016   Eczema 01/28/2013   Allergic rhinitis 01/28/2013    History reviewed. No pertinent surgical history.  OB History     Gravida  0   Para  0   Term  0   Preterm  0   AB  0   Living  0      SAB  0   IAB  0   Ectopic  0   Multiple  0   Live Births  0            Home  Medications    Prior to Admission medications   Medication Sig Start Date End Date Taking? Authorizing Provider  cetirizine (ZYRTEC) 10 MG tablet Take 1 tablet (10 mg total) by mouth daily. 10/18/22   Anabel Halon, MD  Drospirenone-Estetrol (NEXTSTELLIS) 3-14.2 MG TABS Take 1 tablet by mouth daily. 11/15/22   Adline Potter, NP  ibuprofen (ADVIL) 600 MG tablet TAKE 1 TABLET(600 MG) BY MOUTH EVERY 8 HOURS AS NEEDED 11/16/22   Anabel Halon, MD  triamcinolone cream (KENALOG) 0.1 % Apply 1 Application topically 2 (two) times daily. 08/23/22   Anabel Halon, MD    Family History Family History  Problem Relation Age of Onset   Heart attack Maternal Grandfather    Healthy Father    Healthy Mother    Asthma Brother     Social History Social History   Tobacco Use   Smoking status: Never   Smokeless tobacco: Never  Vaping Use   Vaping Use: Never used  Substance Use Topics   Alcohol use: No   Drug use: No     Allergies   Bee pollen   Review of Systems Review of Systems Per HPI  Physical Exam Triage Vital Signs ED Triage Vitals  Enc Vitals Group     BP 11/16/22 0853  127/84     Pulse Rate 11/16/22 0853 98     Resp 11/16/22 0853 17     Temp 11/16/22 0853 98.1 F (36.7 C)     Temp Source 11/16/22 0853 Oral     SpO2 11/16/22 0853 98 %     Weight --      Height --      Head Circumference --      Peak Flow --      Pain Score 11/16/22 0854 0     Pain Loc --      Pain Edu? --      Excl. in GC? --    No data found.  Updated Vital Signs BP 127/84 (BP Location: Right Arm)   Pulse 98   Temp 98.1 F (36.7 C) (Oral)   Resp 17   LMP 11/16/2022 (Exact Date)   SpO2 98%   Visual Acuity Right Eye Distance:   Left Eye Distance:   Bilateral Distance:    Right Eye Near:   Left Eye Near:    Bilateral Near:     Physical Exam Vitals and nursing note reviewed.  Constitutional:      General: She is not in acute distress.    Appearance: Normal appearance.  HENT:      Head: Normocephalic.     Right Ear: Tympanic membrane, ear canal and external ear normal.     Left Ear: Tympanic membrane, ear canal and external ear normal.     Nose: Congestion present. No rhinorrhea.     Right Turbinates: Enlarged and swollen.     Left Turbinates: Enlarged and swollen.     Right Sinus: No maxillary sinus tenderness or frontal sinus tenderness.     Left Sinus: No maxillary sinus tenderness or frontal sinus tenderness.     Mouth/Throat:     Lips: Pink.     Mouth: Mucous membranes are moist.     Pharynx: Oropharynx is clear. Uvula midline. Posterior oropharyngeal erythema present. No pharyngeal swelling, oropharyngeal exudate or uvula swelling.     Comments: Cobblestoning present in posterior oropharynyx Eyes:     Extraocular Movements: Extraocular movements intact.     Conjunctiva/sclera: Conjunctivae normal.     Pupils: Pupils are equal, round, and reactive to light.  Cardiovascular:     Rate and Rhythm: Normal rate and regular rhythm.     Pulses: Normal pulses.     Heart sounds: Normal heart sounds.  Pulmonary:     Effort: Pulmonary effort is normal. No respiratory distress.     Breath sounds: Normal breath sounds. No stridor. No wheezing, rhonchi or rales.  Abdominal:     General: Bowel sounds are normal.     Palpations: Abdomen is soft.     Tenderness: There is no abdominal tenderness.  Musculoskeletal:     Cervical back: Normal range of motion.  Lymphadenopathy:     Cervical: No cervical adenopathy.  Skin:    General: Skin is warm and dry.  Neurological:     General: No focal deficit present.     Mental Status: She is alert and oriented to person, place, and time.  Psychiatric:        Mood and Affect: Mood normal.        Behavior: Behavior normal.      UC Treatments / Results  Labs (all labs ordered are listed, but only abnormal results are displayed) Labs Reviewed - No data to display  EKG   Radiology No results  found.  Procedures Procedures (including critical care time)  Medications Ordered in UC Medications - No data to display  Initial Impression / Assessment and Plan / UC Course  I have reviewed the triage vital signs and the nursing notes.  Pertinent labs & imaging results that were available during my care of the patient were reviewed by me and considered in my medical decision making (see chart for details).  The patient is well-appearing, she is in no acute distress, vital signs are stable.  Patient with positive self-administered home COVID test.  Symptoms started within the past 24 hours.  Patient is a candidate to receive Paxlovid.  Patient will continue symptomatic treatment at home with use allergy nasal sprays, and over-the-counter analgesics/antipyretics for pain or discomfort.  Patient was advised she can take over-the-counter cough and cold medications such as Robitussin or Mucinex as needed.  Discussed isolation precautions, supportive care recommendations, and ER follow-up precautions.  Patient is in agreement with this plan of care and verbalizes understanding.  All questions were answered.  Patient stable for discharge.   Final Clinical Impressions(s) / UC Diagnoses   Final diagnoses:  None   Discharge Instructions   None    ED Prescriptions   None    PDMP not reviewed this encounter.   Abran Cantor, NP 11/16/22 [phone]

## 2022-11-16 NOTE — Discharge Instructions (Signed)
Take medication as prescribed. May continue Tylenol or ibuprofen as needed for pain, fever, general discomfort. Recommend using your allergy nasal spray to help with nasal congestion. Increase fluids and allow for plenty of rest. As discussed, if you do not have a fever, and are taking the medication prescribed, you can return to work as long as you wear a mask. If you develop new shortness of breath, difficulty breathing,, chills, chest pain or other concerns, please go to the emergency department immediately for further evaluation. Follow-up as needed.

## 2022-11-17 LAB — GC/CHLAMYDIA PROBE AMP
Chlamydia trachomatis, NAA: NEGATIVE
Neisseria Gonorrhoeae by PCR: NEGATIVE

## 2022-11-22 ENCOUNTER — Telehealth: Payer: Self-pay | Admitting: *Deleted

## 2022-11-22 NOTE — Telephone Encounter (Signed)
[facility] called to make [place] aware patient was taking Paxlovid with can cause birth control to be less effective.  Called patient and advised she should use condoms while taking medication or anytime she is taking an antibiotic.  Pt verbalized understanding.

## 2022-12-17 ENCOUNTER — Encounter: Payer: Self-pay | Admitting: Internal Medicine

## 2022-12-17 ENCOUNTER — Encounter: Payer: Self-pay | Admitting: Podiatry

## 2023-01-23 ENCOUNTER — Encounter: Payer: Self-pay | Admitting: Internal Medicine

## 2023-01-23 ENCOUNTER — Ambulatory Visit: Payer: Medicaid Other | Admitting: Internal Medicine

## 2023-01-23 VITALS — BP 120/80 | HR 92 | Ht 63.0 in | Wt 115.0 lb

## 2023-01-23 DIAGNOSIS — L84 Corns and callosities: Secondary | ICD-10-CM | POA: Diagnosis not present

## 2023-01-23 DIAGNOSIS — L700 Acne vulgaris: Secondary | ICD-10-CM

## 2023-01-23 MED ORDER — ADAPALENE 0.1 % EX CREA
TOPICAL_CREAM | Freq: Every day | CUTANEOUS | 0 refills | Status: AC
Start: 2023-01-23 — End: ?

## 2023-01-23 NOTE — Assessment & Plan Note (Signed)
Her foot lesions likely callus Advised to use callus cushions If persistent, can contact podiatry

## 2023-01-23 NOTE — Progress Notes (Signed)
Acute Office Visit  Subjective:    Patient ID: Andrea Kidd, female    DOB: 2000-12-14, 22 y.o.   MRN: [id number]  Chief Complaint  Patient presents with   Referral    Patient is needing referral to the dermatologist for her acne   Foot Problem    Patient states after working her feet are water logged    HPI Patient is in today for complaint of soreness on her feet at the end of the day after standing at her workplace. She had shared pictures through MyChart, which shows signs of callus.  Denies any recent injury.  She has tried using softer shoes without much relief.  She also reports facial acne and requests dermatology referral.  She has been using CeraVe facial cleanser.  Past Medical History:  Diagnosis Date   Acne    Eczema 01/28/2013    History reviewed. No pertinent surgical history.  Family History  Problem Relation Age of Onset   Heart attack Maternal Grandfather    Healthy Father    Healthy Mother    Asthma Brother     Social History   Socioeconomic History   Marital status: Single    Spouse name: Not on file   Number of children: Not on file   Years of education: Not on file   Highest education level: 12th grade  Occupational History   Not on file  Tobacco Use   Smoking status: Never   Smokeless tobacco: Never  Vaping Use   Vaping Use: Never used  Substance and Sexual Activity   Alcohol use: No   Drug use: No   Sexual activity: Yes    Birth control/protection: Pill, Condom  Other Topics Concern   Not on file  Social History Narrative   Lives with Mom, Dad and older sister and younger brother. No smokers in the house.    Social Determinants of Health   Financial Resource Strain: Low Risk  (01/19/2023)   Overall Financial Resource Strain (CARDIA)    Difficulty of Paying Living Expenses: Not hard at all  Food Insecurity: No Food Insecurity (01/19/2023)   Hunger Vital Sign    Worried About Running Out of Food in the Last Year: Never true     Ran Out of Food in the Last Year: Never true  Transportation Needs: No Transportation Needs (01/19/2023)   PRAPARE - Administrator, Civil Service (Medical): No    Lack of Transportation (Non-Medical): No  Physical Activity: Sufficiently Active (01/19/2023)   Exercise Vital Sign    Days of Exercise per Week: 6 days    Minutes of Exercise per Session: 120 min  Stress: No Stress Concern Present (01/19/2023)   [employer] of Occupational Health - Occupational Stress Questionnaire    Feeling of Stress : Not at all  Social Connections: Moderately Isolated (01/19/2023)   Social Connection and Isolation Panel [NHANES]    Frequency of Communication with Friends and Family: More than three times a week    Frequency of Social Gatherings with Friends and Family: More than three times a week    Attends Religious Services: Never    Database administrator or Organizations: No    Attends Banker Meetings: Never    Marital Status: Living with partner  Intimate Partner Violence: Not At Risk (11/15/2022)   Humiliation, Afraid, Rape, and Kick questionnaire    Fear of Current or Ex-Partner: No    Emotionally Abused: No  Physically Abused: No    Sexually Abused: No    Outpatient Medications Prior to Visit  Medication Sig Dispense Refill   cetirizine (ZYRTEC) 10 MG tablet Take 1 tablet (10 mg total) by mouth daily. 30 tablet 0   Drospirenone-Estetrol (NEXTSTELLIS) 3-14.2 MG TABS Take 1 tablet by mouth daily. 90 tablet 3   ibuprofen (ADVIL) 600 MG tablet TAKE 1 TABLET(600 MG) BY MOUTH EVERY 8 HOURS AS NEEDED 30 tablet 0   triamcinolone cream (KENALOG) 0.1 % Apply 1 Application topically 2 (two) times daily. 30 g 2   No facility-administered medications prior to visit.    Allergies  Allergen Reactions   Bee Pollen     Review of Systems  Constitutional:  Negative for chills and fever.  HENT:  Negative for postnasal drip, sinus pressure and sore throat.    Respiratory:  Negative for cough and shortness of breath.   Cardiovascular:  Negative for chest pain and palpitations.  Genitourinary:  Negative for dysuria and hematuria.  Musculoskeletal:  Negative for neck pain and neck stiffness.  Skin:  Positive for rash.       Callus on foot  Neurological:  Negative for dizziness and weakness.  Psychiatric/Behavioral:  Negative for agitation and behavioral problems.        Objective:    Physical Exam Vitals reviewed.  Constitutional:      General: She is not in acute distress.    Appearance: She is not diaphoretic.  HENT:     Head: Normocephalic and atraumatic.     Nose: Nose normal.     Mouth/Throat:     Mouth: Mucous membranes are moist.     Pharynx: No oropharyngeal exudate.  Eyes:     General: No scleral icterus.    Extraocular Movements: Extraocular movements intact.  Cardiovascular:     Rate and Rhythm: Normal rate and regular rhythm.     Heart sounds: Normal heart sounds. No murmur heard. Pulmonary:     Breath sounds: Normal breath sounds. No wheezing or rales.  Musculoskeletal:     Right lower leg: No edema.     Left lower leg: No edema.  Feet:     Right foot:     Skin integrity: Callus present.     Left foot:     Skin integrity: Callus present.  Skin:    General: Skin is warm.     Findings: Rash (Acneform rash on face) present.  Neurological:     General: No focal deficit present.     Mental Status: She is alert and oriented to person, place, and time.     Sensory: No sensory deficit.     Motor: No weakness.  Psychiatric:        Mood and Affect: Mood normal.        Behavior: Behavior normal.     BP 120/80 (BP Location: Left Arm, Patient Position: Sitting, Cuff Size: Normal)   Pulse 92   Ht 5\' 3"  (1.6 m)   Wt 115 lb (52.2 kg)   SpO2 98%   BMI 20.37 kg/m  Wt Readings from Last 3 Encounters:  01/23/23 115 lb (52.2 kg) (24 %, Z= -0.71)*  11/15/22 114 lb (51.7 kg) (22 %, Z= -0.76)*  08/23/22 114 lb 9.6 oz  (52 kg) (24 %, Z= -0.70)*   * Growth percentiles are based on CDC (Girls, 2-20 Years) data.        Assessment & Plan:   Problem List Items Addressed This Visit  Musculoskeletal and Integument   Callus of foot    Her foot lesions likely callus Advised to use callus cushions If persistent, can contact podiatry      Acne vulgaris - Primary    Has tried hypoallergenic facial cleanser Started adapalene cream Referred to dermatology as per patient request      Relevant Medications   adapalene (DIFFERIN) 0.1 % cream   Other Relevant Orders   Ambulatory referral to Dermatology     Meds ordered this encounter  Medications   adapalene (DIFFERIN) 0.1 % cream    Sig: Apply topically at bedtime.    Dispense:  45 g    Refill:  0     Shiva Karis Concha Se, MD

## 2023-01-23 NOTE — Patient Instructions (Signed)
Please use Adapalene cream for acne.  Please continue to use CeraVe face cleanser.  Please use callus cushion to avoid direct friction on the foot, which can help with the foot soreness from callus.

## 2023-01-23 NOTE — Assessment & Plan Note (Signed)
Has tried hypoallergenic facial cleanser Started adapalene cream Referred to dermatology as per patient request

## 2023-03-26 ENCOUNTER — Encounter: Payer: Self-pay | Admitting: Internal Medicine

## 2023-04-26 ENCOUNTER — Other Ambulatory Visit: Payer: Self-pay | Admitting: Internal Medicine

## 2023-04-26 DIAGNOSIS — T783XXA Angioneurotic edema, initial encounter: Secondary | ICD-10-CM

## 2023-04-27 DIAGNOSIS — T783XXA Angioneurotic edema, initial encounter: Secondary | ICD-10-CM | POA: Diagnosis not present

## 2023-04-28 ENCOUNTER — Encounter: Payer: Self-pay | Admitting: Internal Medicine

## 2023-04-29 LAB — BASIC METABOLIC PANEL
BUN/Creatinine Ratio: 17 (ref 9–23)
BUN: 13 mg/dL (ref 6–20)
CO2: 23 mmol/L (ref 20–29)
Calcium: 9.6 mg/dL (ref 8.7–10.2)
Chloride: 106 mmol/L (ref 96–106)
Creatinine, Ser: 0.76 mg/dL (ref 0.57–1.00)
Glucose: 76 mg/dL (ref 70–99)
Potassium: 3.7 mmol/L (ref 3.5–5.2)
Sodium: 144 mmol/L (ref 134–144)
eGFR: 115 mL/min/{1.73_m2} (ref >59)

## 2023-04-29 LAB — C3 AND C4
Complement C3, Serum: 115 mg/dL (ref 82–167)
Complement C4, Serum: 27 mg/dL (ref 12–38)

## 2023-04-29 LAB — C1 ESTERASE INHIBITOR: C1INH SerPl-mCnc: 26 mg/dL (ref 21–39)

## 2023-05-02 ENCOUNTER — Encounter: Payer: Self-pay | Admitting: Internal Medicine

## 2023-05-02 ENCOUNTER — Ambulatory Visit: Payer: Medicaid Other | Admitting: Internal Medicine

## 2023-05-02 VITALS — BP 116/78 | HR 96 | Ht 63.0 in | Wt 114.8 lb

## 2023-05-02 DIAGNOSIS — R220 Localized swelling, mass and lump, head: Secondary | ICD-10-CM | POA: Insufficient documentation

## 2023-05-02 DIAGNOSIS — Z2821 Immunization not carried out because of patient refusal: Secondary | ICD-10-CM | POA: Diagnosis not present

## 2023-05-02 MED ORDER — CETIRIZINE HCL 10 MG PO TABS: 10.0000 mg | ORAL_TABLET | Freq: Every day | ORAL | 3 refills | Status: DC

## 2023-05-02 NOTE — Assessment & Plan Note (Signed)
Since 03/26/23, recurrent Was concerning for angioedema - checked C1 esterase inhibitor, C3, C4 - wnl Advised to take Zyrtec 10 mg QD Currently no alarming signs of dyspnea or dysphagia, has mild lip swelling If recurrent or worsening symptoms, will refer to allergy and immunology clinic

## 2023-05-02 NOTE — Patient Instructions (Addendum)
Please start taking Zyrtec regularly.  Please keep food diary to check any association of food with symptoms.  If you develop severe lip swelling or difficulty breathing or swallowing, please get medical evaluation immediately.

## 2023-05-02 NOTE — Progress Notes (Signed)
Acute Office Visit  Subjective:    Patient ID: Andrea Kidd, female    DOB: Jun 16, 2001, 22 y.o.   MRN: [id number]  Chief Complaint  Patient presents with   blood work     Follow up     HPI Patient is in today for complaint of recurrent lip swelling for the last 1 month.  She has tried taking Benadryl with some relief.  She initially thought that it came from [place].  She has stopped taking that fruit, but has had recurrent swelling.  Her BMP, C1 esterase inhibitor and C3, C4 levels were within normal limits.  She has a history of allergic rhinitis and eczema.  She used to take Zyrtec for allergic rhinitis.  Denies any dyspnea or dysphagia currently.  Past Medical History:  Diagnosis Date   Acne    Eczema 01/28/2013    History reviewed. No pertinent surgical history.  Family History  Problem Relation Age of Onset   Heart attack Maternal Grandfather    Healthy Father    Healthy Mother    Asthma Brother     Social History   Socioeconomic History   Marital status: Single    Spouse name: Not on file   Number of children: Not on file   Years of education: Not on file   Highest education level: 12th grade  Occupational History   Not on file  Tobacco Use   Smoking status: Never   Smokeless tobacco: Never  Vaping Use   Vaping status: Never Used  Substance and Sexual Activity   Alcohol use: No   Drug use: No   Sexual activity: Yes    Birth control/protection: Pill, Condom  Other Topics Concern   Not on file  Social History Narrative   Lives with Mom, Dad and older sister and younger brother. No smokers in the house.    Social Determinants of Health   Financial Resource Strain: Low Risk  (01/19/2023)   Overall Financial Resource Strain (CARDIA)    Difficulty of Paying Living Expenses: Not hard at all  Food Insecurity: No Food Insecurity (01/19/2023)   Hunger Vital Sign    Worried About Running Out of Food in the Last Year: Never true    Ran Out of Food in  the Last Year: Never true  Transportation Needs: No Transportation Needs (01/19/2023)   PRAPARE - Administrator, Civil Service (Medical): No    Lack of Transportation (Non-Medical): No  Physical Activity: Sufficiently Active (01/19/2023)   Exercise Vital Sign    Days of Exercise per Week: 6 days    Minutes of Exercise per Session: 120 min  Stress: No Stress Concern Present (01/19/2023)   [employer] of Occupational Health - Occupational Stress Questionnaire    Feeling of Stress : Not at all  Social Connections: Moderately Isolated (01/19/2023)   Social Connection and Isolation Panel [NHANES]    Frequency of Communication with Friends and Family: More than three times a week    Frequency of Social Gatherings with Friends and Family: More than three times a week    Attends Religious Services: Never    Database administrator or Organizations: No    Attends Banker Meetings: Never    Marital Status: Living with partner  Intimate Partner Violence: Not At Risk (11/15/2022)   Humiliation, Afraid, Rape, and Kick questionnaire    Fear of Current or Ex-Partner: No    Emotionally Abused: No    Physically Abused:  No    Sexually Abused: No    Outpatient Medications Prior to Visit  Medication Sig Dispense Refill   adapalene (DIFFERIN) 0.1 % cream Apply topically at bedtime. 45 g 0   Drospirenone-Estetrol (NEXTSTELLIS) 3-14.2 MG TABS Take 1 tablet by mouth daily. 90 tablet 3   ibuprofen (ADVIL) 600 MG tablet TAKE 1 TABLET(600 MG) BY MOUTH EVERY 8 HOURS AS NEEDED 30 tablet 0   triamcinolone cream (KENALOG) 0.1 % Apply 1 Application topically 2 (two) times daily. 30 g 2   cetirizine (ZYRTEC) 10 MG tablet Take 1 tablet (10 mg total) by mouth daily. 30 tablet 0   No facility-administered medications prior to visit.    Allergies  Allergen Reactions   Bee Pollen     Review of Systems  Constitutional:  Negative for chills and fever.  HENT:  Negative for postnasal  drip, sinus pressure and sore throat.   Respiratory:  Negative for cough and shortness of breath.   Cardiovascular:  Negative for chest pain and palpitations.  Genitourinary:  Negative for dysuria and hematuria.  Musculoskeletal:  Negative for neck pain and neck stiffness.  Skin:  Negative for rash.  Neurological:  Negative for dizziness and weakness.  Psychiatric/Behavioral:  Negative for agitation and behavioral problems.        Objective:    Physical Exam Vitals reviewed.  Constitutional:      General: She is not in acute distress.    Appearance: She is not diaphoretic.  HENT:     Head: Normocephalic and atraumatic.     Nose: Nose normal.     Mouth/Throat:     Mouth: Mucous membranes are moist.     Pharynx: No oropharyngeal exudate.     Comments: No lip swelling today, but has been recurrent according to the patient Eyes:     General: No scleral icterus.    Extraocular Movements: Extraocular movements intact.  Cardiovascular:     Rate and Rhythm: Normal rate and regular rhythm.     Heart sounds: Normal heart sounds. No murmur heard. Pulmonary:     Breath sounds: Normal breath sounds. No wheezing or rales.  Musculoskeletal:     Right lower leg: No edema.     Left lower leg: No edema.  Feet:     Right foot:     Skin integrity: Callus present.     Left foot:     Skin integrity: Callus present.  Skin:    General: Skin is warm.     Findings: No rash.  Neurological:     General: No focal deficit present.     Mental Status: She is alert and oriented to person, place, and time.     Sensory: No sensory deficit.     Motor: No weakness.  Psychiatric:        Mood and Affect: Mood normal.        Behavior: Behavior normal.     BP 116/78 (BP Location: Right Arm, Patient Position: Sitting, Cuff Size: Normal)   Pulse 96   Ht 5\' 3"  (1.6 m)   Wt 114 lb 12.8 oz (52.1 kg)   SpO2 98%   BMI 20.34 kg/m  Wt Readings from Last 3 Encounters:  05/02/23 114 lb 12.8 oz (52.1 kg)   01/23/23 115 lb (52.2 kg) (24%, Z= -0.71)*  11/15/22 114 lb (51.7 kg) (22%, Z= -0.76)*   * Growth percentiles are based on CDC (Girls, 2-20 Years) data.        Assessment &  Plan:   Problem List Items Addressed This Visit       Other   Lip swelling - Primary    Since 03/26/23, recurrent Was concerning for angioedema - checked C1 esterase inhibitor, C3, C4 - wnl Advised to take Zyrtec 10 mg QD Currently no alarming signs of dyspnea or dysphagia, has mild lip swelling If recurrent or worsening symptoms, will refer to allergy and immunology clinic      Relevant Medications   cetirizine (ZYRTEC) 10 MG tablet     Meds ordered this encounter  Medications   cetirizine (ZYRTEC) 10 MG tablet    Sig: Take 1 tablet (10 mg total) by mouth daily.    Dispense:  30 tablet    Refill:  3     Ladarion Munyon Concha Se, MD

## 2023-05-11 ENCOUNTER — Ambulatory Visit: Payer: Medicaid Other | Admitting: Internal Medicine

## 2023-06-12 ENCOUNTER — Other Ambulatory Visit: Payer: Self-pay | Admitting: Internal Medicine

## 2023-06-12 ENCOUNTER — Encounter: Payer: Self-pay | Admitting: Internal Medicine

## 2023-06-12 DIAGNOSIS — R220 Localized swelling, mass and lump, head: Secondary | ICD-10-CM

## 2023-06-12 DIAGNOSIS — J309 Allergic rhinitis, unspecified: Secondary | ICD-10-CM

## 2023-06-12 DIAGNOSIS — L309 Dermatitis, unspecified: Secondary | ICD-10-CM

## 2023-06-18 DIAGNOSIS — J069 Acute upper respiratory infection, unspecified: Secondary | ICD-10-CM | POA: Diagnosis not present

## 2023-06-18 DIAGNOSIS — J019 Acute sinusitis, unspecified: Secondary | ICD-10-CM | POA: Diagnosis not present

## 2023-06-18 DIAGNOSIS — U071 COVID-19: Secondary | ICD-10-CM | POA: Diagnosis not present

## 2023-06-18 DIAGNOSIS — J101 Influenza due to other identified influenza virus with other respiratory manifestations: Secondary | ICD-10-CM | POA: Diagnosis not present

## 2023-07-24 ENCOUNTER — Ambulatory Visit: Payer: Medicaid Other | Admitting: Internal Medicine

## 2023-07-24 ENCOUNTER — Encounter: Payer: Self-pay | Admitting: Internal Medicine

## 2023-07-24 VITALS — BP 106/80 | HR 103 | Temp 98.7°F | Resp 16 | Ht 63.39 in | Wt 116.5 lb

## 2023-07-24 DIAGNOSIS — L2389 Allergic contact dermatitis due to other agents: Secondary | ICD-10-CM | POA: Diagnosis not present

## 2023-07-24 DIAGNOSIS — L2084 Intrinsic (allergic) eczema: Secondary | ICD-10-CM

## 2023-07-24 DIAGNOSIS — J3089 Other allergic rhinitis: Secondary | ICD-10-CM

## 2023-07-24 MED ORDER — TRIAMCINOLONE ACETONIDE 0.1 % EX OINT: TOPICAL_OINTMENT | CUTANEOUS | 5 refills | Status: DC

## 2023-07-24 MED ORDER — CETIRIZINE HCL 10 MG PO TABS: 10.0000 mg | ORAL_TABLET | Freq: Every day | ORAL | 5 refills | Status: DC

## 2023-07-24 MED ORDER — FLUTICASONE PROPIONATE 50 MCG/ACT NA SUSP
2.0000 | Freq: Every day | NASAL | 5 refills | Status: DC
Start: 2023-07-24 — End: 2023-10-09

## 2023-07-24 MED ORDER — HYDROCORTISONE 2.5 % EX CREA: TOPICAL_CREAM | CUTANEOUS | 5 refills | Status: DC

## 2023-07-24 NOTE — Patient Instructions (Addendum)
Angioedema/Rash (Lips): - Concerning for contact dermatitis.  If this recurs, will discuss patch testing.   Other Allergic Rhinitis: - Use nasal saline rinses before nose sprays such as with Neilmed Sinus Rinse.  Use distilled water.   - Use Flonase 2 sprays each nostril daily. Aim upward and outward. - Use Zyrtec 10 mg daily.   Eczema: - Do a daily soaking tub bath in warm water for 10-15 minutes.  - Use a gentle, unscented cleanser at the end of the bath (such as Dove unscented bar or baby wash, or Aveeno sensitive body wash). Then rinse, pat half-way dry, and apply a gentle, unscented moisturizer cream or ointment (Cerave, Cetaphil, Eucerin, Aveeno, Vaseline, Aquaphor)  all over while still damp. Dry skin makes the itching and rash of eczema worse. The skin should be moisturized with a gentle, unscented moisturizer at least twice daily.  - Use only unscented liquid laundry detergent. - Apply prescribed topical steroid (triamcinolone 0.1% below neck or hydrocortisone 2.5% above neck) to flared areas (red and thickened eczema) after the moisturizer has soaked into the skin (wait at least 30 minutes). Taper off the topical steroids as the skin improves. Do not use topical steroid for more than 7-10 days at a time.    Follow up: 12/30 at 9 AM for skin testing 1-68 Hold all anti-histamines (Xyzal, Allegra, Zyrtec, Claritin, Benadryl) 3 days prior to next visit.

## 2023-07-24 NOTE — Progress Notes (Signed)
NEW PATIENT  Date of Service/Encounter:  07/24/23  Consult requested by: Anabel Halon, MD   Subjective:   Andrea Kidd (DOB: August 24, 2000) is a 22 y.o. female who presents to the clinic on 07/24/2023 with a chief complaint of Allergic Rhinitis , Eczema (During the summer is when it flares the most), and Angioedema (Chapstick that caused her lip swell) .    History obtained from: chart review and patient.  Rhinitis:  Started at a young age.  Symptoms include: nasal congestion, rhinorrhea, post nasal drainage, watery eyes, and itchy eyes  Occurs seasonally-Spring  Potential triggers: pollen  Treatments tried:  Zyrtec   Previous allergy testing: no History of sinus surgery: no Nonallergic triggers: none   Atopic Dermatitis:  Diagnosed at a very young. Areas that flare commonly are around the eyes, armpits, under breast, behind the knees.  Current regimen: topical steroids PRN in the past but does not have a Rx for anything now, vaseline eczema relief    Reports use of fragrance/dye free products Identified triggers of flares include summer time ar Sleep is not affected  Rash/Swelling:  Started with itchy rash around the lips and inside of mouth and then developed swelling.  The rash was red and dry and irritated.  PCP started her on steroids and it resolved.  However, it kept recurring until she realized that it was the new Summer Fridays chapstick she had started using.  Since stopping it, she has not had any outbreaks. At one point, also thought it was Rambutans but she stopped eating them and still kept having the rash.  Last outbreak was around end of September/October.  No hives.  No other swelling episodes.    Reviewed:  05/02/2023: seen by Dr Allena Katz for lip swelling for 1 month.  Thought to be related to [place] fruit but still having swelling despite stopping it.  C1 esterase inhibitor level, C3, C4 were all normal.  Started on Zyrtec daily.   03/14/2022: seen  in urgent care for congestion, cough. Works at [employer]. Not using Flonase. Started on Bromfed and Zyrtec and start Flonase.  Discussed possibly viral URI vs allergies.   05/19/2021: hx of asthma/allergies p/w cough, sinus pressure, congestion, ear fullness.  Discussed starting amoxicillin for sinusitis.    Past Medical History: Past Medical History:  Diagnosis Date   Acne    Asthma    Eczema 01/28/2013    Past Surgical History: History reviewed. No pertinent surgical history.  Family History: Family History  Problem Relation Age of Onset   Healthy Mother    Healthy Father    Allergic rhinitis Brother    Eczema Brother    Asthma Brother    Heart attack Maternal Grandfather    Angioedema Neg Hx     Social History:  Flooring in bedroom: laminate Pets: cat and dog Tobacco use/exposure: vaping started 2024 Job: waitress   Medication List:  Allergies as of 07/24/2023       Reactions   Bee Pollen         Medication List        Accurate as of July 24, 2023  9:52 AM. If you have any questions, ask your nurse or doctor.          adapalene 0.1 % cream Commonly known as: Differin Apply topically at bedtime.   cetirizine 10 MG tablet Commonly known as: ZYRTEC Take 1 tablet (10 mg total) by mouth daily.   ibuprofen 600 MG tablet Commonly known as:  ADVIL TAKE 1 TABLET(600 MG) BY MOUTH EVERY 8 HOURS AS NEEDED   levocetirizine 5 MG tablet Commonly known as: XYZAL Take 5 mg by mouth every evening.   Nextstellis 3-14.2 MG Tabs Generic drug: Drospirenone-Estetrol Take 1 tablet by mouth daily.   triamcinolone cream 0.1 % Commonly known as: KENALOG Apply 1 Application topically 2 (two) times daily.         REVIEW OF SYSTEMS: Pertinent positives and negatives discussed in HPI.   Objective:   Physical Exam: BP 106/80   Pulse (!) 103   Temp 98.7 F (37.1 C)   Resp 16   Ht 5' 3.39" (1.61 m)   Wt 116 lb 8 oz (52.8 kg)   SpO2 97%   BMI 20.39 kg/m   Body mass index is 20.39 kg/m. GEN: alert, well developed HEENT: clear conjunctiva, nose with + mild inferior turbinate hypertrophy, pink nasal mucosa, slight clear rhinorrhea, no cobblestoning HEART: regular rate and rhythm, no murmur LUNGS: clear to auscultation bilaterally, no coughing, unlabored respiration ABDOMEN: soft, non distended  SKIN: slight erythematous dry patch near R eye  Assessment:   1. Other allergic rhinitis   2. Intrinsic atopic dermatitis   3. Allergic contact dermatitis due to other agents     Plan/Recommendations:  Angioedema/Rash (Lips): - Normal C3/C4 and C1 esterase function - Concerning for contact dermatitis.  If this recurs, will discuss patch testing.   Other Allergic Rhinitis: - Due to turbinate hypertrophy, seasonal symptoms and unresponsive to over the counter meds, will perform skin testing to identify aeroallergen triggers.   - Use nasal saline rinses before nose sprays such as with Neilmed Sinus Rinse.  Use distilled water.   - Use Flonase 2 sprays each nostril daily. Aim upward and outward. - Use Zyrtec 10 mg daily.   Eczema: - Do a daily soaking tub bath in warm water for 10-15 minutes.  - Use a gentle, unscented cleanser at the end of the bath (such as Dove unscented bar or baby wash, or Aveeno sensitive body wash). Then rinse, pat half-way dry, and apply a gentle, unscented moisturizer cream or ointment (Cerave, Cetaphil, Eucerin, Aveeno)  all over while still damp. Dry skin makes the itching and rash of eczema worse. The skin should be moisturized with a gentle, unscented moisturizer at least twice daily.  - Use only unscented liquid laundry detergent. - Apply prescribed topical steroid (triamcinolone 0.1% below neck or hydrocortisone 2.5% above neck) to flared areas (red and thickened eczema) after the moisturizer has soaked into the skin (wait at least 30 minutes). Taper off the topical steroids as the skin improves. Do not use topical  steroid for more than 7-10 days at a time.    Follow up: 12/30 at 9 AM for skin testing 1-68, IDs okay  Hold all anti-histamines (Xyzal, Allegra, Zyrtec, Claritin, Benadryl) 3 days prior to next visit.    Alesia Morin, MD [facility]

## 2023-07-31 ENCOUNTER — Ambulatory Visit: Payer: Medicaid Other | Admitting: Internal Medicine

## 2023-07-31 DIAGNOSIS — J301 Allergic rhinitis due to pollen: Secondary | ICD-10-CM | POA: Diagnosis not present

## 2023-07-31 DIAGNOSIS — J3089 Other allergic rhinitis: Secondary | ICD-10-CM

## 2023-07-31 DIAGNOSIS — J3081 Allergic rhinitis due to animal (cat) (dog) hair and dander: Secondary | ICD-10-CM

## 2023-07-31 DIAGNOSIS — L272 Dermatitis due to ingested food: Secondary | ICD-10-CM | POA: Diagnosis not present

## 2023-07-31 NOTE — Patient Instructions (Addendum)
Angioedema/Rash (Lips): - Concerning for contact dermatitis.  If this recurs, will discuss patch testing.   Allergic Rhinitis: - Positive skin test 07/2023: positive to weeds, trees, molds, dust mites, cats, horses  - Avoidance measures discussed. - Use nasal saline rinses before nose sprays such as with Neilmed Sinus Rinse.  Use distilled water.   - Use Flonase 2 sprays each nostril daily. Aim upward and outward. - Use Zyrtec 10 mg daily.  - Consider allergy shots as long term control of your symptoms by teaching your immune system to be more tolerant of your allergy triggers  Eczema: - SPT 07/2023: negative to commonly allergenic foods  - Do a daily soaking tub bath in warm water for 10-15 minutes.  - Use a gentle, unscented cleanser at the end of the bath (such as Dove unscented bar or baby wash, or Aveeno sensitive body wash). Then rinse, pat half-way dry, and apply a gentle, unscented moisturizer cream or ointment (Cerave, Cetaphil, Eucerin, Aveeno, Vaseline, Aquaphor)  all over while still damp. Dry skin makes the itching and rash of eczema worse. The skin should be moisturized with a gentle, unscented moisturizer at least twice daily.  - Use only unscented liquid laundry detergent. - Apply prescribed topical steroid (triamcinolone 0.1% below neck or hydrocortisone 2.5% above neck) to flared areas (red and thickened eczema) after the moisturizer has soaked into the skin (wait at least 30 minutes). Taper off the topical steroids as the skin improves. Do not use topical steroid for more than 7-10 days at a time.   ALLERGEN AVOIDANCE MEASURES   Dust Mites Use central air conditioning and heat; and change the filter monthly.  Pleated filters work better than mesh filters.  Electrostatic filters may also be used; wash the filter monthly.  Window air conditioners may be used, but do not clean the air as well as a central air conditioner.  Change or wash the filter monthly. Keep windows  closed.  Do not use attic fans.   Encase the mattress, box springs and pillows with zippered, dust proof covers. Wash the bed linens in hot water weekly.   Remove carpet, especially from the bedroom. Remove stuffed animals, throw pillows, dust ruffles, heavy drapes and other items that collect dust from the bedroom. Do not use a humidifier.   Use wood, vinyl or leather furniture instead of cloth furniture in the bedroom. Keep the indoor humidity at 30 - 40%.   Molds - Outdoor avoidance Avoid being outside when the grass is being mowed, or the ground is tilled. Avoid playing in leaves, pine straw, hay, etc.  Dead plant materials contain mold. Avoid going into barns or grain storage areas. Remove leaves, clippings and compost from around the home. Pollen Avoidance Pollen levels are highest during the mid-day and afternoon.  Consider this when planning outdoor activities. Avoid being outside when the grass is being mowed, or wear a mask if the pollen-allergic person must be the one to mow the grass. Keep the windows closed to keep pollen outside of the home. Use an air conditioner to filter the air. Take a shower, wash hair, and change clothing after working or playing outdoors during pollen season. Pet Dander Keep the pet out of your bedroom and restrict it to only a few rooms. Be advised that keeping the pet in only one room will not limit the allergens to that room. Don't pet, hug or kiss the pet; if you do, wash your hands with soap and water. High-efficiency particulate  air (HEPA) cleaners run continuously in a bedroom or living room can reduce allergen levels over time. Regular use of a high-efficiency vacuum cleaner or a central vacuum can reduce allergen levels. Giving your pet a bath at least once a week can reduce airborne allergen.

## 2023-07-31 NOTE — Progress Notes (Signed)
FOLLOW UP Date of Service/Encounter:  07/31/23   Subjective:  Andrea Kidd (DOB: [birth year]) is a 22 y.o. female who [facility] for follow up for skin testing.   History obtained from: chart review and patient.  Anti histamines held.   Past Medical History: Past Medical History:  Diagnosis Date   Acne    Asthma    Eczema 01/28/2013    Objective:  There were no vitals taken for this visit. There is no height or weight on file to calculate BMI. Physical Exam: GEN: alert, well developed HEENT: clear conjunctiva, MMM LUNGS: unlabored respiration  Skin Testing:  Skin prick testing was placed, which includes aeroallergens/foods, histamine control, and saline control.  Verbal consent was obtained prior to placing test.  Patient tolerated procedure well.  Allergy testing results were read and interpreted by myself, documented by clinical staff. Adequate positive and negative control.  Positive results to:  Results discussed with patient/family.  Airborne Adult Perc - 07/31/23 1002     Time Antigen Placed 1002    Allergen Manufacturer Greer    Location Back    Number of Test 55    1. Control-Buffer 50% Glycerol Negative    2. Control-Histamine 2+    3. Bahia Negative    4. French Southern Territories Negative    5. Johnson Negative    6. Kentucky Blue Negative    7. Meadow Fescue Negative    8. Perennial Rye Negative    9. Timothy Negative    10. Ragweed Mix Negative    11. Cocklebur Negative    12. Plantain,  English Negative    13. Baccharis Negative    14. Dog Fennel Negative    15. Russian Thistle 3+    16. Lamb's Quarters 2+    17. Sheep Sorrell Negative    18. Rough Pigweed Negative    19. Marsh Elder, Rough 3+    20. Mugwort, Common Negative    21. Box, Elder Negative    22. Cedar, red Negative    23. Sweet Gum Negative    24. Pecan Pollen Negative    25. Pine Mix Negative    26. Walnut, Black Pollen Negative    27. Red  Mulberry Negative    28. Ash Mix Negative    29. Birch Mix Negative    30. Beech American Negative    31. Cottonwood, Guinea-Bissau Negative    32. Hickory, White Negative    33. Maple Mix 3+    34. Oak, Guinea-[place]iss[place] Mix Negative    35. Sycamore Eastern Negative    36. Alternaria Alternata Negative    37. Cladosporium Herbarum Negative    38. Aspergillus Mix Negative    39. Penicillium Mix Negative    40. Bipolaris Sorokiniana (Helminthosporium) Negative    41. Drechslera Spicifera (Curvularia) Negative    42. Mucor Plumbeus Negative    43. Fusarium Moniliforme Negative    44. Aureobasidium Pullulans (pullulara) 3+    45. Rhizopus Oryzae Negative    46. Botrytis Cinera 3+    47. Epicoccum Nigrum Negative    48. Phoma Betae Negative    49. Dust Mite Mix 3+    50. Cat Hair 10,000 BAU/ml 2+    51.  Dog Epithelia Negative    52. Mixed Feathers Negative    53. Horse Epithelia 3+    54. Cockroach, German Negative    55. Tobacco Leaf Negative  Intradermal - 07/31/23 0939     Time Antigen Placed 4098    Allergen Manufacturer Waynette Buttery    Location Arm    Number of Test 11    Control Negative    Bahia Negative    French Southern Territories Negative    Johnson Negative    7 Grass Negative    Ragweed Mix Negative    Mold 1 Negative    Mold 2 Negative    Mold 3 Negative    Dog Negative    Cockroach Negative             Food Adult Perc - 07/31/23 1000     Time Antigen Placed 1003    Allergen Manufacturer Waynette Buttery     Control-buffer 50% Glycerol Negative    Control-Histamine 2+    1. Peanut Negative    2. Soybean Negative    3. Wheat Negative    4. Sesame Negative    5. Milk, Cow Negative    6. Casein Negative    7. Egg White, Chicken Negative    8. Shellfish Mix Negative    9. Fish Mix Negative    10. Cashew --   2X2   11. Walnut Food Negative    12. Almond Negative    13. Hazelnut Negative              Assessment:   1. Seasonal allergic rhinitis due to pollen   2.  Allergic rhinitis caused by mold   3. Allergic rhinitis due to animal hair or dander   4. Allergic rhinitis due to dust mite   5. Dermatitis due to ingested food     Plan/Recommendations:  Angioedema/Rash (Lips): - Normal C3/C4 and C1 esterase function - Concerning for contact dermatitis.  If this recurs, will discuss patch testing.   Allergic Rhinitis: - Due to turbinate hypertrophy, seasonal symptoms, eczema and unresponsive to over the counter meds, will perform skin testing to identify aeroallergen triggers.   - Positive skin test 07/2023: positive to weeds, trees, molds, dust mites, cats, horses  - Avoidance measures discussed. - Use nasal saline rinses before nose sprays such as with Neilmed Sinus Rinse.  Use distilled water.   - Use Flonase 2 sprays each nostril daily. Aim upward and outward. - Use Zyrtec 10 mg daily.  - Consider allergy shots as long term control of your symptoms by teaching your immune system to be more tolerant of your allergy triggers  Eczema: - SPT 07/2023: negative to commonly allergenic foods  - Do a daily soaking tub bath in warm water for 10-15 minutes.  - Use a gentle, unscented cleanser at the end of the bath (such as Dove unscented bar or baby wash, or Aveeno sensitive body wash). Then rinse, pat half-way dry, and apply a gentle, unscented moisturizer cream or ointment (Cerave, Cetaphil, Eucerin, Aveeno, Vaseline, Aquaphor)  all over while still damp. Dry skin makes the itching and rash of eczema worse. The skin should be moisturized with a gentle, unscented moisturizer at least twice daily.  - Use only unscented liquid laundry detergent. - Apply prescribed topical steroid (triamcinolone 0.1% below neck or hydrocortisone 2.5% above neck) to flared areas (red and thickened eczema) after the moisturizer has soaked into the skin (wait at least 30 minutes). Taper off the topical steroids as the skin improves. Do not use topical steroid for more than 7-10 days  at a time.       Return in about 2 months (around 09/29/2023).  Mateus Rewerts  Allena Katz, MD [facility]

## 2023-08-28 ENCOUNTER — Ambulatory Visit (INDEPENDENT_AMBULATORY_CARE_PROVIDER_SITE_OTHER): Payer: Medicaid Other | Admitting: Internal Medicine

## 2023-08-28 ENCOUNTER — Encounter: Payer: Self-pay | Admitting: Internal Medicine

## 2023-08-28 VITALS — BP 116/79 | HR 89 | Ht 63.0 in | Wt 119.2 lb

## 2023-08-28 DIAGNOSIS — J309 Allergic rhinitis, unspecified: Secondary | ICD-10-CM | POA: Diagnosis not present

## 2023-08-28 DIAGNOSIS — Z8481 Family history of carrier of genetic disease: Secondary | ICD-10-CM | POA: Diagnosis not present

## 2023-08-28 DIAGNOSIS — Z8041 Family history of malignant neoplasm of ovary: Secondary | ICD-10-CM | POA: Diagnosis not present

## 2023-08-28 DIAGNOSIS — Z0001 Encounter for general adult medical examination with abnormal findings: Secondary | ICD-10-CM | POA: Diagnosis not present

## 2023-08-28 DIAGNOSIS — E559 Vitamin D deficiency, unspecified: Secondary | ICD-10-CM | POA: Insufficient documentation

## 2023-08-28 DIAGNOSIS — L309 Dermatitis, unspecified: Secondary | ICD-10-CM

## 2023-08-28 NOTE — Assessment & Plan Note (Signed)
Last vitamin D Lab Results  Component Value Date   VD25OH 20.6 (L) 07/22/2021   Continue vitamin D supplement

## 2023-08-28 NOTE — Assessment & Plan Note (Addendum)
Well controlled with Zyrtec Was on Singulair in the past Flonase as needed Has been evaluated by Allergy and immunology clinic

## 2023-08-28 NOTE — Progress Notes (Signed)
Established Patient Office Visit  Subjective:  Patient ID: Andrea Kidd, female    DOB: May 07, 2001  Age: 23 y.o. MRN: [id number]  CC:  Chief Complaint  Patient presents with   Annual Exam    Cpe.    HPI Andrea Kidd is a 23 y.o. female with past medical history of allergic rhinitis, asthma and eczema who presents for annual physical.  She has been evaluated by allergy and immunology clinic for possible angioedema-like reaction.  Her allergy testing was positive for some environmental agents (weeds, trees, molds, dust mites, cats, horses).  She has started taking Zyrtec.  Denies any recent episode of lip swelling.  She uses Flonase for allergic rhinitis.  She reports improvement in her menstrual bleeding with OCPs now.  Denies any vaginal discharge.  She has history of eczema and uses steroid cream over it.  She reports that her maternal grandmother had BRCA gene mutation and ovarian cancer.  Past Medical History:  Diagnosis Date   Acne    Asthma    Eczema 01/28/2013    History reviewed. No pertinent surgical history.  Family History  Problem Relation Age of Onset   Healthy Mother    Healthy Father    Allergic rhinitis Brother    Eczema Brother    Asthma Brother    Heart attack Maternal Grandfather    Angioedema Neg Hx     Social History   Socioeconomic History   Marital status: Single    Spouse name: Not on file   Number of children: Not on file   Years of education: Not on file   Highest education level: 12th grade  Occupational History   Not on file  Tobacco Use   Smoking status: Never   Smokeless tobacco: Never  Vaping Use   Vaping status: Some Days   Substances: Nicotine  Substance and Sexual Activity   Alcohol use: No   Drug use: No   Sexual activity: Yes    Birth control/protection: Pill, Condom  Other Topics Concern   Not on file  Social History Narrative   Lives with Mom, Dad and older sister and younger brother. No smokers in the  house.    Social Drivers of Corporate investment banker Strain: Low Risk  (01/19/2023)   Overall Financial Resource Strain (CARDIA)    Difficulty of Paying Living Expenses: Not hard at all  Food Insecurity: No Food Insecurity (01/19/2023)   Hunger Vital Sign    Worried About Running Out of Food in the Last Year: Never true    Ran Out of Food in the Last Year: Never true  Transportation Needs: No Transportation Needs (01/19/2023)   PRAPARE - Administrator, Civil Service (Medical): No    Lack of Transportation (Non-Medical): No  Physical Activity: Sufficiently Active (01/19/2023)   Exercise Vital Sign    Days of Exercise per Week: 6 days    Minutes of Exercise per Session: 120 min  Stress: No Stress Concern Present (01/19/2023)   [employer] of Occupational Health - Occupational Stress Questionnaire    Feeling of Stress : Not at all  Social Connections: Moderately Isolated (01/19/2023)   Social Connection and Isolation Panel [NHANES]    Frequency of Communication with Friends and Family: More than three times a week    Frequency of Social Gatherings with Friends and Family: More than three times a week    Attends Religious Services: Never    Database administrator or  Organizations: No    Attends Banker Meetings: Never    Marital Status: Living with partner  Intimate Partner Violence: Not At Risk (11/15/2022)   Humiliation, Afraid, Rape, and Kick questionnaire    Fear of Current or Ex-Partner: No    Emotionally Abused: No    Physically Abused: No    Sexually Abused: No    Outpatient Medications Prior to Visit  Medication Sig Dispense Refill   adapalene (DIFFERIN) 0.1 % cream Apply topically at bedtime. 45 g 0   cetirizine (ZYRTEC) 10 MG tablet Take 1 tablet (10 mg total) by mouth daily. 30 tablet 5   Drospirenone-Estetrol (NEXTSTELLIS) 3-14.2 MG TABS Take 1 tablet by mouth daily. 90 tablet 3   fluticasone (FLONASE) 50 MCG/ACT nasal spray Place 2 sprays  into both nostrils daily. 16 g 5   hydrocortisone 2.5 % cream Apply twice daily for flare ups above neck, maximum 7 days. 30 g 5   ibuprofen (ADVIL) 600 MG tablet TAKE 1 TABLET(600 MG) BY MOUTH EVERY 8 HOURS AS NEEDED 30 tablet 0   triamcinolone ointment (KENALOG) 0.1 % Apply twice daily for flare ups below neck, maximum 10 days. 80 g 5   levocetirizine (XYZAL) 5 MG tablet Take 5 mg by mouth every evening.     No facility-administered medications prior to visit.    Allergies  Allergen Reactions   Bee Pollen     ROS Review of Systems  Constitutional:  Negative for chills and fever.  HENT:  Negative for postnasal drip, sinus pressure and sore throat.   Eyes:  Negative for pain and discharge.  Respiratory:  Negative for cough and shortness of breath.   Cardiovascular:  Negative for chest pain and palpitations.  Gastrointestinal:  Negative for constipation, diarrhea, nausea and vomiting.  Genitourinary:  Negative for dysuria and hematuria.  Musculoskeletal:  Negative for neck pain and neck stiffness.  Skin:  Negative for rash.  Neurological:  Negative for dizziness and weakness.  Psychiatric/Behavioral:  Negative for agitation and behavioral problems.       Objective:    Physical Exam Vitals reviewed.  Constitutional:      General: She is not in acute distress.    Appearance: She is not diaphoretic.  HENT:     Head: Normocephalic and atraumatic.     Nose: Nose normal.     Mouth/Throat:     Mouth: Mucous membranes are moist.     Pharynx: No oropharyngeal exudate.  Eyes:     General: No scleral icterus.    Extraocular Movements: Extraocular movements intact.  Cardiovascular:     Rate and Rhythm: Normal rate and regular rhythm.     Heart sounds: Normal heart sounds. No murmur heard. Pulmonary:     Breath sounds: Normal breath sounds. No wheezing or rales.  Abdominal:     Palpations: Abdomen is soft.     Tenderness: There is no abdominal tenderness.  Musculoskeletal:      Cervical back: Neck supple. No tenderness.     Right lower leg: No edema.     Left lower leg: No edema.  Skin:    General: Skin is warm.     Findings: No rash.  Neurological:     General: No focal deficit present.     Mental Status: She is alert and oriented to person, place, and time.     Sensory: No sensory deficit.     Motor: No weakness.  Psychiatric:        Mood  and Affect: Mood normal.        Behavior: Behavior normal.     BP 116/79   Pulse 89   Ht 5\' 3"  (1.6 m)   Wt 119 lb 3.2 oz (54.1 kg)   SpO2 98%   BMI 21.12 kg/m  Wt Readings from Last 3 Encounters:  08/28/23 119 lb 3.2 oz (54.1 kg)  07/24/23 116 lb 8 oz (52.8 kg)  05/02/23 114 lb 12.8 oz (52.1 kg)    Lab Results  Component Value Date   TSH 1.660 07/22/2021   Lab Results  Component Value Date   WBC 6.6 08/23/2022   HGB 14.9 08/23/2022   HCT 43.1 08/23/2022   MCV 85 08/23/2022   PLT 314 08/23/2022   Lab Results  Component Value Date   NA 144 04/27/2023   K 3.7 04/27/2023   CO2 23 04/27/2023   GLUCOSE 76 04/27/2023   BUN 13 04/27/2023   CREATININE 0.76 04/27/2023   BILITOT 0.8 07/22/2021   ALKPHOS 74 07/22/2021   AST 18 07/22/2021   ALT 11 07/22/2021   PROT 6.9 07/22/2021   ALBUMIN 4.6 07/22/2021   CALCIUM 9.6 04/27/2023   EGFR 115 04/27/2023   No results found for: "CHOL" No results found for: "HDL" No results found for: "LDLCALC" No results found for: "TRIG" No results found for: "CHOLHDL" No results found for: "HGBA1C"    Assessment & Plan:   Problem List Items Addressed This Visit       Respiratory   Allergic rhinitis   Well controlled with Zyrtec Was on Singulair in the past Flonase as needed Has been evaluated by Allergy and immunology clinic      Relevant Orders   CBC with Differential/Platelet   CMP14+EGFR     Musculoskeletal and Integument   Eczema   Has hydrocortisone cream as needed Kenalog cream for below neck rash        Other   Encounter for general  adult medical examination with abnormal findings - Primary   Physical exam as documented. Blood tests - CBC, CMP today. Denies HPV vaccine for now.      Family history of ovarian cancer   Maternal grandmother had ovarian cancer, but was told of BRCA gene mutation Check BRCA1 and BRCA2 test      Relevant Orders   BRCAssure BRCA1 Targeted   BRCAssure BRCA2 Targeted   Vitamin D deficiency   Last vitamin D Lab Results  Component Value Date   VD25OH 20.6 (L) 07/22/2021   Continue vitamin D supplement      Relevant Orders   Vitamin D (25 hydroxy)   Other Visit Diagnoses       Family history of BRCA gene mutation       Relevant Orders   BRCAssure BRCA1 Targeted   BRCAssure BRCA2 Targeted       No orders of the defined types were placed in this encounter.   Follow-up: Return in about 1 year (around 08/27/2024) for Annual physical.    Anabel Halon, MD

## 2023-08-28 NOTE — Assessment & Plan Note (Addendum)
Physical exam as documented. Blood tests - CBC, CMP today. Denies HPV vaccine for now.

## 2023-08-28 NOTE — Patient Instructions (Signed)
Please continue to take medications as prescribed.  Please continue to eat at regular intervals, maintain balanced diet and perform moderate exercise/walking at least 150 mins/week.

## 2023-08-28 NOTE — Assessment & Plan Note (Signed)
Has hydrocortisone cream as needed Kenalog cream for below neck rash

## 2023-08-28 NOTE — Assessment & Plan Note (Signed)
Maternal grandmother had ovarian cancer, but was told of BRCA gene mutation Check BRCA1 and BRCA2 test

## 2023-08-29 LAB — BRCASSURE BRCA1 TARGETED

## 2023-08-29 LAB — BRCASSURE BRCA2 TARGETED

## 2023-08-29 LAB — VITAMIN D 25 HYDROXY (VIT D DEFICIENCY, FRACTURES): Vit D, 25-Hydroxy: 14.2 ng/mL — ABNORMAL LOW (ref 30.0–100.0)

## 2023-08-30 ENCOUNTER — Telehealth: Payer: Self-pay

## 2023-08-30 LAB — SPECIMEN STATUS REPORT

## 2023-08-30 NOTE — Telephone Encounter (Signed)
Spoke to [place] with [facility] regarding lab [employer], states they are only able to run the test if there is proof of documentation of family member with dx of brcassure, however the test can be changed to the general BRCassure which doesn't require documentation , advised that it should be fine to do, will fax over a form to have PCP sign agreeing to change the test. Verbally informed Dr. Allena Katz of this will be looking out for the fax.

## 2023-08-30 NOTE — Telephone Encounter (Signed)
Copied from CRM [id number]. Topic: Clinical - Lab/Test Results >> Aug 29, 2023  3:10 PM Ivette P wrote: Reason for CRM: Irving Burton called in to go over some lab results for the pt that came in. BRCA that was ordered needs more information to run the order, or will have to update to a different test.  She is requesting a callback [phone].

## 2023-09-12 ENCOUNTER — Other Ambulatory Visit (INDEPENDENT_AMBULATORY_CARE_PROVIDER_SITE_OTHER): Payer: Medicaid Other

## 2023-09-12 ENCOUNTER — Other Ambulatory Visit (HOSPITAL_COMMUNITY)
Admission: RE | Admit: 2023-09-12 | Discharge: 2023-09-12 | Disposition: A | Discharge status: Home / Self Care | Payer: Medicaid Other | Source: Ambulatory Visit | Attending: Obstetrics & Gynecology | Admitting: Obstetrics & Gynecology

## 2023-09-12 DIAGNOSIS — N898 Other specified noninflammatory disorders of vagina: Secondary | ICD-10-CM

## 2023-09-12 NOTE — Progress Notes (Signed)
   NURSE VISIT- VAGINITIS  SUBJECTIVE:  Andrea Kidd is a 23 y.o. G0P0000 GYN patientfemale here for a vaginal swab for vaginitis screening.  She reports the following symptoms: discharge described as brown, light pink  for 1 week. Denies abnormal vaginal bleeding, significant pelvic pain, fever, or UTI symptoms.  OBJECTIVE:  There were no vitals taken for this visit.  Appears well, in no apparent distress  ASSESSMENT: Vaginal swab for vaginitis screening  PLAN: Self-collected vaginal probe for Gonorrhea, Chlamydia, Trichomonas, Bacterial Vaginosis, Yeast sent to lab Treatment: to be determined once results are received Follow-up as needed if symptoms persist/worsen, or new symptoms develop  Jobe Marker  09/12/2023 10:41 AM

## 2023-09-13 LAB — CERVICOVAGINAL ANCILLARY ONLY
Bacterial Vaginitis (gardnerella): NEGATIVE
Candida Glabrata: NEGATIVE
Candida Vaginitis: NEGATIVE
Chlamydia: NEGATIVE
Comment: NEGATIVE
Comment: NEGATIVE
Comment: NEGATIVE
Comment: NEGATIVE
Comment: NEGATIVE
Comment: NORMAL
Neisseria Gonorrhea: NEGATIVE
Trichomonas: NEGATIVE

## 2023-09-18 ENCOUNTER — Encounter: Payer: Self-pay | Admitting: Internal Medicine

## 2023-09-18 LAB — CBC WITH DIFFERENTIAL/PLATELET
Basophils Absolute: 0.1 10*3/uL (ref 0.0–0.2)
Basos: 1 %
EOS (ABSOLUTE): 0.1 10*3/uL (ref 0.0–0.4)
Eos: 1 %
Hematocrit: 44.2 % (ref 34.0–46.6)
Hemoglobin: 15 g/dL (ref 11.1–15.9)
Immature Grans (Abs): 0 10*3/uL (ref 0.0–0.1)
Immature Granulocytes: 0 %
Lymphocytes Absolute: 2 10*3/uL (ref 0.7–3.1)
Lymphs: 24 %
MCH: 29.8 pg (ref 26.6–33.0)
MCHC: 33.9 g/dL (ref 31.5–35.7)
MCV: 88 fL (ref 79–97)
Monocytes Absolute: 0.5 10*3/uL (ref 0.1–0.9)
Monocytes: 5 %
Neutrophils Absolute: 5.8 10*3/uL (ref 1.4–7.0)
Neutrophils: 69 %
Platelets: 319 10*3/uL (ref 150–450)
RBC: 5.04 x10E6/uL (ref 3.77–5.28)
RDW: 12.3 % (ref 11.7–15.4)
WBC: 8.5 10*3/uL (ref 3.4–10.8)

## 2023-09-18 LAB — BRCASSURE COMPREHENSIVE PANEL

## 2023-09-18 LAB — CMP14+EGFR
ALT: 11 [IU]/L (ref 0–32)
AST: 15 [IU]/L (ref 0–40)
Albumin: 4.7 g/dL (ref 4.0–5.0)
Alkaline Phosphatase: 53 [IU]/L (ref 42–106)
BUN/Creatinine Ratio: 16 (ref 9–23)
BUN: 13 mg/dL (ref 6–20)
Bilirubin Total: 0.8 mg/dL (ref 0.0–1.2)
CO2: 21 mmol/L (ref 20–29)
Calcium: 9.8 mg/dL (ref 8.7–10.2)
Chloride: 106 mmol/L (ref 96–106)
Creatinine, Ser: 0.79 mg/dL (ref 0.57–1.00)
Globulin, Total: 2.2 g/dL (ref 1.5–4.5)
Glucose: 64 mg/dL — ABNORMAL LOW (ref 70–99)
Potassium: 4 mmol/L (ref 3.5–5.2)
Sodium: 141 mmol/L (ref 134–144)
Total Protein: 6.9 g/dL (ref 6.0–8.5)
eGFR: 110 mL/min/{1.73_m2} (ref >59)

## 2023-09-27 ENCOUNTER — Encounter: Payer: Self-pay | Admitting: Internal Medicine

## 2023-10-09 ENCOUNTER — Other Ambulatory Visit: Payer: Self-pay

## 2023-10-09 ENCOUNTER — Ambulatory Visit: Payer: Medicaid Other | Admitting: Internal Medicine

## 2023-10-09 VITALS — BP 100/78 | HR 99 | Temp 98.8°F | Ht 63.0 in | Wt 119.0 lb

## 2023-10-09 DIAGNOSIS — L253 Unspecified contact dermatitis due to other chemical products: Secondary | ICD-10-CM

## 2023-10-09 DIAGNOSIS — J302 Other seasonal allergic rhinitis: Secondary | ICD-10-CM | POA: Diagnosis not present

## 2023-10-09 DIAGNOSIS — J3089 Other allergic rhinitis: Secondary | ICD-10-CM | POA: Diagnosis not present

## 2023-10-09 DIAGNOSIS — L2084 Intrinsic (allergic) eczema: Secondary | ICD-10-CM

## 2023-10-09 DIAGNOSIS — J4520 Mild intermittent asthma, uncomplicated: Secondary | ICD-10-CM | POA: Diagnosis not present

## 2023-10-09 MED ORDER — TRIAMCINOLONE ACETONIDE 0.1 % EX OINT: TOPICAL_OINTMENT | CUTANEOUS | 5 refills | Status: DC

## 2023-10-09 MED ORDER — ALBUTEROL SULFATE HFA 108 (90 BASE) MCG/ACT IN AERS: 1.0000 | INHALATION_SPRAY | Freq: Four times a day (QID) | RESPIRATORY_TRACT | 1 refills | Status: DC | PRN

## 2023-10-09 MED ORDER — HYDROCORTISONE 2.5 % EX CREA
TOPICAL_CREAM | CUTANEOUS | 5 refills | Status: DC
Start: 2023-10-09 — End: 2024-05-07

## 2023-10-09 MED ORDER — FLUTICASONE PROPIONATE 50 MCG/ACT NA SUSP: 2.0000 | Freq: Every day | NASAL | 5 refills | Status: AC

## 2023-10-09 MED ORDER — CETIRIZINE HCL 10 MG PO TABS: 10.0000 mg | ORAL_TABLET | Freq: Every day | ORAL | 5 refills | Status: DC

## 2023-10-09 MED ORDER — AZELASTINE HCL 0.1 % NA SOLN: 2.0000 | Freq: Two times a day (BID) | NASAL | 5 refills | Status: DC | PRN

## 2023-10-09 NOTE — Progress Notes (Signed)
 FOLLOW UP Date of Service/Encounter:  10/09/23   Subjective:  Andrea Kidd (DOB: 09/30/2000) is a 23 y.o. female who [facility] for follow up for asthma, allergic rhinitis, eczema and contact dermatitis.   History obtained from: chart review and patient.  Reports a history of asthma, usually has trouble with shortness of breath during Summer.  Has required albuterol in the past and would like to keep it for PRN use. No recent spirometry. No oral prednisone or ER/urgent care visits in last year related to asthma.   Allergies are up and down.  Has noted some improvement in congestion, drainage, sneezing with use of Flonase and Zyrtec but generlaly Spring/Summer are worse.   No further rashes around eyes or lips.  Has stopped after she discontinued the lip balm she was prevoiusly using.  Eczema is doing well.  Sometimes breaks out in itchy dry rash behind knees and requires triamcinolone but this is not too frequently. Also breaks out if she uses fragrance/dye products.   Past Medical History: Past Medical History:  Diagnosis Date   Acne    Asthma    Eczema 01/28/2013    Objective:  BP 100/78   Pulse 99   Temp 98.8 F (37.1 C)   Ht 5\' 3"  (1.6 m)   Wt 119 lb (54 kg)   SpO2 97%   BMI 21.08 kg/m  Body mass index is 21.08 kg/m. Physical Exam: GEN: alert, well developed HEENT: clear conjunctiva, nose with mild inferior turbinate hypertrophy, pink nasal mucosa, + slight mucoid rhinorrhea, slight  cobblestoning HEART: regular rate and rhythm, no murmur LUNGS: clear to auscultation bilaterally, no coughing, unlabored respiration SKIN: no rashes or lesions  Spirometry:  Tracings reviewed. Her effort: Good reproducible efforts. FVC: 4.19L, 124% predicted  FEV1: 3.22L, 107% predicted FEV1/FVC ratio: 77% Interpretation: Spirometry consistent with normal pattern.  Please see scanned spirometry results for details.   Assessment:    1. Seasonal and perennial allergic rhinitis   2. Intrinsic atopic dermatitis   3. Mild intermittent asthma, uncomplicated   4. Contact dermatitis due to chemicals     Plan/Recommendations:  Mild Intermittent Asthma - MDI technique discussed.  Spirometry today was normal.  Will have her use PRN Albuterol if symptomatic.  - Maintenance inhaler: none - Rescue inhaler: Albuterol 1-2 puffs every 4-6 hours as needed for respiratory symptoms of shortness of breath, or wheezing Asthma control goals:  Full participation in all desired activities (may need albuterol before activity) Albuterol use two times or less a week on average (not counting use with activity) Cough interfering with sleep two times or less a month Oral steroids no more than once a year No hospitalizations  Angioedema/Rash (Lips/Eyes): - Concerning for contact dermatitis. Likely related to make up products. Has improved with discontinuation of lip balm.  - If this recurs, consider patch testing.   Allergic Rhinitis: - Controlled, generally worse in Spring/Summer, discussed adding INAH. If still persistent, consider AIT.  - Positive skin test 07/2023: positive to weeds, trees, molds, dust mites, cats, horses  - Use nasal saline rinses before nose sprays such as with Neilmed Sinus Rinse.  Use distilled water.   - Use Flonase 2 sprays each nostril daily. Aim upward and outward. - Use Azelastine 2 sprays each nostril twice daily as needed for congestion, drainage, sneezing. Aim upward and outward. - Use Zyrtec 10 mg daily.  - Consider allergy shots as long term control of your symptoms  by teaching your immune system to be more tolerant of your allergy triggers  Eczema: - Controlled  - SPT 07/2023: negative to commonly allergenic foods  - Do a daily soaking tub bath in warm water for 10-15 minutes.  - Use a gentle, unscented cleanser at the end of the bath (such as Dove unscented bar or baby wash, or Aveeno sensitive body  wash). Then rinse, pat half-way dry, and apply a gentle, unscented moisturizer cream or ointment (Cerave, Cetaphil, Eucerin, Aveeno, Vaseline, Aquaphor)  all over while still damp. Dry skin makes the itching and rash of eczema worse. The skin should be moisturized with a gentle, unscented moisturizer at least twice daily.  - Use only unscented liquid laundry detergent. - Apply prescribed topical steroid (triamcinolone 0.1% below neck or hydrocortisone 2.5% above neck) to flared areas (red and thickened eczema) after the moisturizer has soaked into the skin (wait at least 30 minutes). Taper off the topical steroids as the skin improves. Do not use topical steroid for more than 7-10 days at a time.        Return in about 6 months (around 04/10/2024).  Alesia Morin, MD [facility]

## 2023-10-09 NOTE — Patient Instructions (Addendum)
 Mild Intermittent Asthma - Rescue inhaler: Albuterol 1-2 puffs every 4-6 hours as needed for respiratory symptoms of shortness of breath, or wheezing Asthma control goals:  Full participation in all desired activities (may need albuterol before activity) Albuterol use two times or less a week on average (not counting use with activity) Cough interfering with sleep two times or less a month Oral steroids no more than once a year No hospitalizations   Angioedema/Rash (Lips/Eyes): - Concerning for contact dermatitis. Likely related to make up products. Has improved with discontinuation of lip balm.  - If this recurs, consider patch testing.   Allergic Rhinitis: - Positive skin test 07/2023: positive to weeds, trees, molds, dust mites, cats, horses  - Use nasal saline rinses before nose sprays such as with Neilmed Sinus Rinse.  Use distilled water.   - Use Flonase 2 sprays each nostril daily. Aim upward and outward. - Use Azelastine 2 sprays each nostril twice daily as needed for congestion, drainage, sneezing. Aim upward and outward. - Use Zyrtec 10 mg daily.  - Consider allergy shots as long term control of your symptoms by teaching your immune system to be more tolerant of your allergy triggers  Eczema: - SPT 07/2023: negative to commonly allergenic foods  - Do a daily soaking tub bath in warm water for 10-15 minutes.  - Use a gentle, unscented cleanser at the end of the bath (such as Dove unscented bar or baby wash, or Aveeno sensitive body wash). Then rinse, pat half-way dry, and apply a gentle, unscented moisturizer cream or ointment (Cerave, Cetaphil, Eucerin, Aveeno, Vaseline, Aquaphor)  all over while still damp. Dry skin makes the itching and rash of eczema worse. The skin should be moisturized with a gentle, unscented moisturizer at least twice daily.  - Use only unscented liquid laundry detergent. - Apply prescribed topical steroid (triamcinolone 0.1% below neck or hydrocortisone  2.5% above neck) to flared areas (red and thickened eczema) after the moisturizer has soaked into the skin (wait at least 30 minutes). Taper off the topical steroids as the skin improves. Do not use topical steroid for more than 7-10 days at a time.

## 2023-11-20 ENCOUNTER — Encounter: Payer: Self-pay | Admitting: Adult Health

## 2023-11-20 ENCOUNTER — Other Ambulatory Visit (HOSPITAL_COMMUNITY)
Admission: RE | Admit: 2023-11-20 | Discharge: 2023-11-20 | Disposition: A | Discharge status: Home / Self Care | Source: Ambulatory Visit | Attending: Adult Health | Admitting: Adult Health

## 2023-11-20 ENCOUNTER — Ambulatory Visit: Payer: Medicaid Other | Admitting: Adult Health

## 2023-11-20 VITALS — BP 114/78 | HR 89 | Ht 63.0 in | Wt 117.5 lb

## 2023-11-20 DIAGNOSIS — Z124 Encounter for screening for malignant neoplasm of cervix: Secondary | ICD-10-CM | POA: Diagnosis not present

## 2023-11-20 DIAGNOSIS — Z1331 Encounter for screening for depression: Secondary | ICD-10-CM | POA: Diagnosis not present

## 2023-11-20 DIAGNOSIS — Z0000 Encounter for general adult medical examination without abnormal findings: Secondary | ICD-10-CM | POA: Insufficient documentation

## 2023-11-20 DIAGNOSIS — Z3041 Encounter for surveillance of contraceptive pills: Secondary | ICD-10-CM | POA: Diagnosis not present

## 2023-11-20 DIAGNOSIS — Z113 Encounter for screening for infections with a predominantly sexual mode of transmission: Secondary | ICD-10-CM | POA: Insufficient documentation

## 2023-11-20 MED ORDER — NEXTSTELLIS 3-14.2 MG PO TABS: 1.0000 | ORAL_TABLET | Freq: Every day | ORAL | 3 refills | Status: DC

## 2023-11-20 NOTE — Progress Notes (Signed)
 Patient ID: Andrea Kidd, female   DOB: 07/14/2001, 23 y.o.   MRN: [id number] History of Present Illness: Andrea Kidd is a 23 year old white female, with SO, G0P0, in for well woman gyn exam and first pap. She is happy with nextstellis .  PCP is Dr Lydia Sams   Current Medications, Allergies, Past Medical History, Past Surgical History, Family History and Social History were reviewed in [employer] electronic medical record.     Review of Systems: Patient denies any headaches, hearing loss, fatigue, blurred vision, shortness of breath, chest pain, abdominal pain, problems with bowel movements, urination, or intercourse. No joint pain or mood swings.  Has some decrease in sex drive with OCP   Physical Exam:BP 114/78 (BP Location: Right Arm, Patient Position: Sitting, Cuff Size: Normal)   Pulse 89   Ht 5\' 3"  (1.6 m)   Wt 117 lb 8 oz (53.3 kg)   LMP 11/14/2023 (Exact Date)   BMI 20.81 kg/m   General:  Well developed, well nourished, no acute distress Skin:  Warm and dry Neck:  Midline trachea, normal thyroid, good ROM, no lymphadenopathy Lungs; Clear to auscultation bilaterally Breast:  No dominant palpable mass, retraction, or nipple discharge Cardiovascular: Regular rate and rhythm Abdomen:  Soft, non tender, no hepatosplenomegaly Pelvic:  External genitalia is normal in appearance, no lesions.  The vagina is normal in appearance. Urethra has no lesions or masses. The cervix is nulliparous, pap with GC/CHL performed.  Uterus is felt to be normal size, shape, and contour.  No adnexal masses or tenderness noted.Bladder is non tender, no masses felt. Extremities/musculoskeletal:  No swelling or varicosities noted, no clubbing or cyanosis Psych:  No mood changes, alert and cooperative,seems happy AA is 1    11/20/2023   10:33 AM 08/28/2023   12:53 PM 05/02/2023    8:42 AM  Depression screen PHQ 2/9  Decreased Interest 0 0 0  Down, Depressed, Hopeless 0 0 0  PHQ - 2 Score 0 0 0   Altered sleeping 0 0   Tired, decreased energy 1 0   Change in appetite 0 0   Feeling bad or failure about yourself  0 0   Trouble concentrating 0 0   Moving slowly or fidgety/restless 0 0   Suicidal thoughts 0 0   PHQ-9 Score 1 0   Difficult doing work/chores  Not difficult at all        11/20/2023   10:33 AM 08/28/2023   12:53 PM 11/15/2022    8:38 AM 10/18/2021    3:09 PM  GAD 7 : Generalized Anxiety Score  Nervous, Anxious, on Edge 0 0 0 0  Control/stop worrying 0 0 0 0  Worry too much - different things 0 0 0 0  Trouble relaxing 0 0 0 0  Restless 0 0 0 0  Easily annoyed or irritable 0 0 0 0  Afraid - awful might happen 0 0 0 0  Total GAD 7 Score 0 0 0 0  Anxiety Difficulty  Not difficult at all        Upstream - 11/20/23 1032       Pregnancy Intention Screening   Does the patient want to become pregnant in the next year? Unsure    Does the patient's partner want to become pregnant in the next year? Unsure    Would the patient like to discuss contraceptive options today? No      Contraception Wrap Up   Current Method Oral Contraceptive  End Method Oral Contraceptive    Contraception Counseling Provided Yes            Examination chaperoned by Alphonso Aschoff LPN   Impression and plan: 1. Routine general medical examination at a health care facility (Primary) Pap sent Physical in 1 year Labs with PCP - Cytology - PAP( [facility])  2. Encounter for surveillance of contraceptive pills Happy with nextstellis , will refill Meds ordered this encounter  Medications   Drospirenone-Estetrol (NEXTSTELLIS ) 3-14.2 MG TABS    Sig: Take 1 tablet by mouth daily.    Dispense:  90 tablet    Refill:  3    Supervising Provider:   Evalyn Hillier H [[id number]]

## 2023-11-22 LAB — CYTOLOGY - PAP
Adequacy: ABSENT
Chlamydia: NEGATIVE
Comment: NEGATIVE
Comment: NORMAL
Diagnosis: NEGATIVE
Neisseria Gonorrhea: NEGATIVE

## 2023-12-10 DIAGNOSIS — H5213 Myopia, bilateral: Secondary | ICD-10-CM | POA: Diagnosis not present

## 2024-04-08 ENCOUNTER — Ambulatory Visit: Admitting: Internal Medicine

## 2024-05-07 ENCOUNTER — Ambulatory Visit: Admitting: Internal Medicine

## 2024-05-07 ENCOUNTER — Other Ambulatory Visit: Payer: Self-pay

## 2024-05-07 ENCOUNTER — Encounter: Payer: Self-pay | Admitting: Internal Medicine

## 2024-05-07 VITALS — BP 118/70 | HR 80 | Temp 98.7°F | Resp 18 | Ht 63.0 in | Wt 126.6 lb

## 2024-05-07 DIAGNOSIS — L2084 Intrinsic (allergic) eczema: Secondary | ICD-10-CM

## 2024-05-07 DIAGNOSIS — J4520 Mild intermittent asthma, uncomplicated: Secondary | ICD-10-CM

## 2024-05-07 DIAGNOSIS — L253 Unspecified contact dermatitis due to other chemical products: Secondary | ICD-10-CM

## 2024-05-07 DIAGNOSIS — J302 Other seasonal allergic rhinitis: Secondary | ICD-10-CM

## 2024-05-07 DIAGNOSIS — J3089 Other allergic rhinitis: Secondary | ICD-10-CM

## 2024-05-07 MED ORDER — ALBUTEROL SULFATE HFA 108 (90 BASE) MCG/ACT IN AERS: 1.0000 | INHALATION_SPRAY | Freq: Four times a day (QID) | RESPIRATORY_TRACT | 1 refills | Status: AC | PRN

## 2024-05-07 MED ORDER — CETIRIZINE HCL 10 MG PO TABS: 10.0000 mg | ORAL_TABLET | Freq: Every day | ORAL | 5 refills | Status: AC | PRN

## 2024-05-07 MED ORDER — HYDROCORTISONE 2.5 % EX CREA: TOPICAL_CREAM | CUTANEOUS | 5 refills | Status: AC

## 2024-05-07 MED ORDER — AZELASTINE HCL 0.1 % NA SOLN: 2.0000 | Freq: Two times a day (BID) | NASAL | 5 refills | Status: AC | PRN

## 2024-05-07 MED ORDER — TRIAMCINOLONE ACETONIDE 0.1 % EX OINT: TOPICAL_OINTMENT | CUTANEOUS | 5 refills | Status: AC

## 2024-05-07 NOTE — Progress Notes (Signed)
 FOLLOW UP Date of Service/Encounter:  05/07/24   Subjective:  Andrea Kidd (DOB: 2001/02/02) is a 23 y.o. female who [facility] for follow up for asthma, allergic rhinitis, eczema and possible contact dermatitis.    History obtained from: chart review and patient. Last seen by me 10/09/2023 and at the time was doing well with minor flare ups of allergic rhinitis and eczema.  Asthma- PRN Albuterol  AR- Flonase , Azelastine  PRN, Zyrtec  Eczema- topical steroids Rash/Angioedema of lips/eyes, if recurs, consider patch testing.   Reports asthma has done very well.  Only needs Albuterol  a few times a month when she is active and it is very hot.  No ER/urgent care/oral prednisone since last visit.  Reports allergies are doing well too, only needs Zyrtec  PRN.  Denies frequent congestion, drainage, runny nose.  Does have flare ups of eczema on and off with small patches.  These do respond to topical steroids. Moisturizing regularly.   Also notes possible make up allergy , again developed a rash under the eyes with puffiness after using bridal make up.   Past Medical History: Past Medical History:  Diagnosis Date   Acne    Asthma    Eczema 01/28/2013    Objective:  BP 118/70 (BP Location: Left Arm, Patient Position: Sitting, Cuff Size: Normal)   Pulse 80   Temp 98.7 F (37.1 C) (Temporal)   Resp 18   Ht 5' 3 (1.6 m)   Wt 126 lb 9.6 oz (57.4 kg)   SpO2 99%   BMI 22.43 kg/m  Body mass index is 22.43 kg/m. Physical Exam: GEN: alert, well developed HEENT: clear conjunctiva, nose with mild inferior turbinate hypertrophy, pink nasal mucosa, no rhinorrhea, no cobblestoning HEART: regular rate and rhythm, no murmur LUNGS: clear to auscultation bilaterally, no coughing, unlabored respiration SKIN: small dry erythematous patch on right lower abdomen   Spirometry:  Tracings reviewed. Her effort: It was hard to get consistent efforts and  there is a question as to whether this reflects a maximal maneuver. FVC: 4.12L, 121% predicted  FEV1: 2.66L, 88% predicted FEV1/FVC ratio: 65% Interpretation: Spirometry consistent with mild obstructive disease.  Please see scanned spirometry results for details.  Assessment:   1. Intrinsic atopic dermatitis   2. Seasonal and perennial allergic rhinitis   3. Mild intermittent asthma, uncomplicated   4. Contact dermatitis due to chemicals     Plan/Recommendations:  Mild Intermittent Asthma - Controlled, spirometry today with some obstruction but symptomatically doing well.  - Rescue inhaler: Albuterol  1-2 puffs every 4-6 hours as needed for respiratory symptoms of shortness of breath, or wheezing Asthma control goals:  Full participation in all desired activities (may need albuterol  before activity) Albuterol  use two times or less a week on average (not counting use with activity) Cough interfering with sleep two times or less a month Oral steroids no more than once a year No hospitalizations  Allergic Rhinitis: - Controlled  - Positive skin test 07/2023: positive to weeds, trees, molds, dust mites, cats, horses  - Use nasal saline rinses before nose sprays such as with Neilmed Sinus Rinse.  Use distilled water.   - Use Azelastine  2 sprays each nostril twice daily as needed for congestion, drainage, sneezing. Aim upward and outward. - Use Zyrtec  10 mg daily as needed for runny nose, sneezing, itchy watery eyes.  - Consider allergy  shots as long term control of your symptoms by teaching your immune system to be more tolerant  of your allergy  triggers  Eczema: - Controlled, few flare ups, responds to topical steroids  - SPT 07/2023: negative to commonly allergenic foods  - Do a daily soaking tub bath in warm water for 10-15 minutes.  - Use a gentle, unscented cleanser at the end of the bath (such as Dove unscented bar or baby wash, or Aveeno sensitive body wash). Then rinse, pat  half-way dry, and apply a gentle, unscented moisturizer cream or ointment (Cerave, Cetaphil, Eucerin, Aveeno, Vaseline, Aquaphor)  all over while still damp. Dry skin makes the itching and rash of eczema worse. The skin should be moisturized with a gentle, unscented moisturizer at least twice daily.  - Use only unscented liquid laundry detergent. - Apply prescribed topical steroid (triamcinolone  0.1% below neck or hydrocortisone  2.5% above neck) to flared areas (red and thickened eczema) after the moisturizer has soaked into the skin (wait at least 30 minutes). Taper off the topical steroids as the skin improves. Do not use topical steroid for more than 7-10 days at a time.   Angioedema/Rash (Lips/Eyes): - Concerning for contact dermatitis. Likely related to make up products.  - Recommend NAC 80 patch test. Concern for contact dermatitis Discussed with patient that patch testing tests for contact dermatitis. Positive patch testing results can help in avoiding those items.  There is a chance of false negative results or the chemical causing your reaction is not part of the patch panel. The sensitivity of patch testing can range from 60-80% and therefore false negative results are possible.  Therefore, this does not definitively rule out contact dermatitis.  Please schedule for patch testing in our [facility] office - will need to come Monday, Wednesday, Friday. Please avoid strenuous physical activities and do not get the patches on the back wet.  Okay to take antihistamines for itching but avoid placing any creams on the back where the patches are. No steroids for 2 weeks before.    Follow up: Patch testing NAC 80 3 months      Arleta Blanch, MD [facility] [place] 

## 2024-05-07 NOTE — Patient Instructions (Addendum)
 Mild Intermittent Asthma - Rescue inhaler: Albuterol  1-2 puffs every 4-6 hours as needed for respiratory symptoms of shortness of breath, or wheezing Asthma control goals:  Full participation in all desired activities (may need albuterol  before activity) Albuterol  use two times or less a week on average (not counting use with activity) Cough interfering with sleep two times or less a month Oral steroids no more than once a year No hospitalizations  Allergic Rhinitis: - Positive skin test 07/2023: positive to weeds, trees, molds, dust mites, cats, horses  - Use nasal saline rinses before nose sprays such as with Neilmed Sinus Rinse.  Use distilled water.   - Use Azelastine  2 sprays each nostril twice daily as needed for congestion, drainage, sneezing. Aim upward and outward. - Use Zyrtec  10 mg daily as needed for runny nose, sneezing, itchy watery eyes.  - Consider allergy  shots as long term control of your symptoms by teaching your immune system to be more tolerant of your allergy  triggers  Eczema: - SPT 07/2023: negative to commonly allergenic foods  - Do a daily soaking tub bath in warm water for 10-15 minutes.  - Use a gentle, unscented cleanser at the end of the bath (such as Dove unscented bar or baby wash, or Aveeno sensitive body wash). Then rinse, pat half-way dry, and apply a gentle, unscented moisturizer cream or ointment (Cerave, Cetaphil, Eucerin, Aveeno, Vaseline, Aquaphor)  all over while still damp. Dry skin makes the itching and rash of eczema worse. The skin should be moisturized with a gentle, unscented moisturizer at least twice daily.  - Use only unscented liquid laundry detergent. - Apply prescribed topical steroid (triamcinolone  0.1% below neck or hydrocortisone  2.5% above neck) to flared areas (red and thickened eczema) after the moisturizer has soaked into the skin (wait at least 30 minutes). Taper off the topical steroids as the skin improves. Do not use topical steroid  for more than 7-10 days at a time.   Angioedema/Rash (Lips/Eyes): - Concerning for contact dermatitis. Likely related to make up products.  - Recommend NAC 80 patch test. Concern for contact dermatitis Discussed with patient that patch testing tests for contact dermatitis. Positive patch testing results can help in avoiding those items.  There is a chance of false negative results or the chemical causing your reaction is not part of the patch panel. The sensitivity of patch testing can range from 60-80% and therefore false negative results are possible.  Therefore, this does not definitively rule out contact dermatitis.  Please schedule for patch testing in our [facility] office - will need to come Monday, Wednesday, Friday. Please avoid strenuous physical activities and do not get the patches on the back wet.  Okay to take antihistamines for itching but avoid placing any creams on the back where the patches are. No steroids for 2 weeks before.    Follow up: Patch testing NAC 80 3 months

## 2024-05-27 ENCOUNTER — Encounter: Payer: Self-pay | Admitting: Family Medicine

## 2024-05-27 ENCOUNTER — Ambulatory Visit: Admitting: Family Medicine

## 2024-05-27 DIAGNOSIS — L253 Unspecified contact dermatitis due to other chemical products: Secondary | ICD-10-CM | POA: Diagnosis not present

## 2024-05-27 NOTE — Patient Instructions (Signed)
 Diagnostics: NAC 80 patches placed NAC-80 (1-80)   1. Ammonium persulfate  2. [place] Balsam  3. Omitted  4. 4-tert-Butylphenolformaldehyde resin (PTBP)  5. Bacitracin  6. Budesonide  7. Quaternium-15  8. Cinnamal  9. Cobalt(II) chloride hexahydrate  10. Colophonium  11. Methyldibromo glutaronitrile  12. Decyl Glucoside  13. Ethylenediamine dihydrochloride  14. 2-Hydroxyethyl methacrylate  15. Hydroperoxides of Linalool  16. Iodopropynyl butylcarbamate  17. 2-Mercaptobenzothiazole (MBT)  18. Thiuram mix  19. METHYLISOTHIAZOLINONE  20. Propylene glycol  21. 1,3-Diphenylguanidine  22. Hydroperoxides of Limonene  23. Black rubber mix  24. Carba mix  25. Fragrance mix I  26. Fragrance mix II  27. Textile dye mix II  28. Neomycin sulfate  29. Nickel(II) sulfate hexahydrate  30. p-Phenylenediamine (PPD)  31. Potassium dichromate  32. Propolis  33. Sodium Metabisulfite  34. Tixocortol-21-pivalate  35. Lanolin alcohol  36. Methylisothiazolinone + Methylchloroisothiazolinone  37. Cocamidopropyl betaine  38. 3-(Dimethylamino)-1-propylamine  39. Formaldehyde  40. Oleamidopropyl dimethylamine  41. 2-Bromo-2-Nitropropane-l,3-diol  42. Diazolidinyl urea  43. DMDM Hydantoin  44. Epoxy resin, Bisphenol A  45. Benzophenone-4  46. Imidazolidinyl urea  47. Lauryl polyglucose  48 Methyl methacrylate  49. Paraben mix  50. Mercapto mix  51. Caine mix III  52. Mixed dialkyl thiourea  53. Compositae mix II  54. Toluenesulfonamide formaldehyde resin  55. Tea Tree Oil oxidized  56. Ylang-Ylang oil  57. Amidoamine  58. Amerchol L 101  59. Benzocaine  60. Benzyl alchohol  61. Benzyl salicylate  62. Chloroxylenol (PCMX)  63. Cocamide DEA  64. Clobetasol-17-propionate  65. Toluene-2,5-Diamine sulfate  66. Ethyl acrylate  67. N-Isopropyl-N-phenyl--4-phenylenediamine (IPPD)  68. Lidocaine   69. Omitted  70. Sesquiterpene lactone mix  71. 2-n-Octyl-4-isothiazolin-3-one  72. Propyl  gallate  73. Polymyxin B sulfate  74. Pramoxine hydrochloride  75. Sodium benzoate  76. Sorbitan oleate  77. Sorbitan sesquioleate  78. Tocopherol  79. BENZALKONIUM CHLORIDE  80. Chlorhexidine digluconate    Allergic contact dermatitis - Instructions provided on care of the patches for the next 48 hours. Andrea Kidd was instructed to avoid showering for the next 48 hours. Andrea Kidd will follow up in 48 hours and 96 hours for patch readings.    Call the clinic if this treatment plan is not working well for you  Follow up in 2 days or sooner if needed.

## 2024-05-27 NOTE — Progress Notes (Signed)
 Follow-up Note  RE: Andrea Kidd MRN: [id number] DOB: Jul 30, 2001 Date of Office Visit: 05/27/2024  Primary care provider: Tobie Suzzane POUR, MD Referring provider: Tobie Suzzane POUR, MD   Andrea Kidd returns to the office today for the patch test placement, given suspected history of contact dermatitis. Care of patches discussed in detail especially the need to keep the patches dry. All Questions answered at today's visit.    Diagnostics: NAC 80 patches placed NAC-80 (1-80)   1. Ammonium persulfate  2. [place] Balsam  3. Omitted  4. 4-tert-Butylphenolformaldehyde resin (PTBP)  5. Bacitracin  6. Budesonide  7. Quaternium-15  8. Cinnamal  9. Cobalt(II) chloride hexahydrate  10. Colophonium  11. Methyldibromo glutaronitrile  12. Decyl Glucoside  13. Ethylenediamine dihydrochloride  14. 2-Hydroxyethyl methacrylate  15. Hydroperoxides of Linalool  16. Iodopropynyl butylcarbamate  17. 2-Mercaptobenzothiazole (MBT)  18. Thiuram mix  19. METHYLISOTHIAZOLINONE  20. Propylene glycol  21. 1,3-Diphenylguanidine  22. Hydroperoxides of Limonene  23. Black rubber mix  24. Carba mix  25. Fragrance mix I  26. Fragrance mix II  27. Textile dye mix II  28. Neomycin sulfate  29. Nickel(II) sulfate hexahydrate  30. p-Phenylenediamine (PPD)  31. Potassium dichromate  32. Propolis  33. Sodium Metabisulfite  34. Tixocortol-21-pivalate  35. Lanolin alcohol  36. Methylisothiazolinone + Methylchloroisothiazolinone  37. Cocamidopropyl betaine  38. 3-(Dimethylamino)-1-propylamine  39. Formaldehyde  40. Oleamidopropyl dimethylamine  41. 2-Bromo-2-Nitropropane-l,3-diol  42. Diazolidinyl urea  43. DMDM Hydantoin  44. Epoxy resin, Bisphenol A  45. Benzophenone-4  46. Imidazolidinyl urea  47. Lauryl polyglucose  48 Methyl methacrylate  49. Paraben mix  50. Mercapto mix  51. Caine mix III  52. Mixed dialkyl thiourea  53. Compositae mix II  54. Toluenesulfonamide formaldehyde resin  55. Tea  Tree Oil oxidized  56. Ylang-Ylang oil  57. Amidoamine  58. Amerchol L 101  59. Benzocaine  60. Benzyl alchohol  61. Benzyl salicylate  62. Chloroxylenol (PCMX)  63. Cocamide DEA  64. Clobetasol-17-propionate  65. Toluene-2,5-Diamine sulfate  66. Ethyl acrylate  67. N-Isopropyl-N-phenyl--4-phenylenediamine (IPPD)  68. Lidocaine   69. Omitted  70. Sesquiterpene lactone mix  71. 2-n-Octyl-4-isothiazolin-3-one  72. Propyl gallate  73. Polymyxin B sulfate  74. Pramoxine hydrochloride  75. Sodium benzoate  76. Sorbitan oleate  77. Sorbitan sesquioleate  78. Tocopherol  79. BENZALKONIUM CHLORIDE  80. Chlorhexidine digluconate    Allergic contact dermatitis - Instructions provided on care of the patches for the next 48 hours. Andrea Kidd was instructed to avoid showering for the next 48 hours. Andrea Kidd will follow up in 48 hours and 96 hours for patch readings.    Call the clinic if this treatment plan is not working well for you  Follow up in 2 days or sooner if needed.  Thank you for the opportunity to care for this patient.  Please do not hesitate to contact me with questions.  Arlean Mutter, FNP [facility] [place]  [facility]

## 2024-05-29 ENCOUNTER — Ambulatory Visit (INDEPENDENT_AMBULATORY_CARE_PROVIDER_SITE_OTHER): Admitting: Internal Medicine

## 2024-05-29 DIAGNOSIS — L253 Unspecified contact dermatitis due to other chemical products: Secondary | ICD-10-CM

## 2024-05-29 NOTE — Patient Instructions (Signed)
 SABRA

## 2024-05-29 NOTE — Progress Notes (Unsigned)
 Follow Up Note  RE: Andrea Kidd MRN: [id number] DOB: April 26, 2001 Date of Office Visit: 05/29/2024  Referring provider: Tobie Suzzane POUR, MD Primary care provider: Tobie Suzzane POUR, MD  History of Present Illness: I had the pleasure of seeing Karne Ozga for a follow up visit at the [facility] [place] on 05/29/2024. She is a 23 y.o. female, who is being followed for contact dermatitis. Today she is here for initial patch test interpretation, given suspected history of contact dermatitis.   Diagnostics:   NAC 80 48-hour hour reading:  NAC Panel Tested NAC-80 (1-80)   1. Ammonium persulfate Negative   2. [place] Balsam Negative   3. BENZISOTHIAZOLINONE OMIT  4. 4-tert-Butylphenolformaldehyde resin (PTBP) Negative   5. Bacitracin Negative   6. Budesonide Negative   7. Quaternium-15 Negative   8. Cinnamal Negative   9. Cobalt(II) chloride hexahydrate Negative  10. Colophonium Negative   11. Methyldibromo glutaronitrile Negative   12. Decyl Glucoside Negative   13. Ethylenediamine dihydrochloride Negative   14. 2-Hydroxyethyl methacrylate Negative   15. Hydroperoxides of Linalool Negative   16. Iodopropynyl butylcarbamate Negative   17. 2-Mercaptobenzothiazole (MBT) Negative   18. Thiuram mix Negative   19. METHYLISOTHIAZOLINONE Negative   20. Propylene glycol Negative   21. 1,3-Diphenylguanidine Negative   22. Hydroperoxides of Limonene Negative   23. Black rubber mix Negative   24. Carba mix Negative   25. Fragrance mix I Negative   26. Fragrance mix II Negative   27. Textile dye mix II Negative   28. Neomycin sulfate Negative   29. Nickel(II) sulfate hexahydrate Negative  30. p-Phenylenediamine (PPD) Negative   31. Potassium dichromate Negative   32. Propolis Negative   33. Sodium Metabisulfite Negative   34. Tixocortol-21-pivalate Negative   35. Lanolin alcohol Negative   36. Methylisothiazolinone + Methylchloroisothiazolinone Negative   37.  Cocamidopropyl betaine Negative   38. 3-(Dimethylamino)-1-propylamine Negative   39. Formaldehyde Negative  40. Oleamidopropyl dimethylamine Negative   41. 2-Bromo-2-Nitropropane-l,3-diol Negative   42. Diazolidinyl urea Negative  43. DMDM Hydantoin Negative   44. Epoxy resin, Bisphenol A Negative   45. Benzophenone-4 Negative   46. Imidazolidinyl urea Negative   47. Lauryl polyglucose Negative  48 Methyl methacrylate Negative   49. Paraben mix Negative   50. Mercapto mix Negative   51. Caine mix III Negative   52. Mixed dialkyl thiourea Negative   53. Compositae mix II Negative   54. Toluenesulfonamide formaldehyde resin Negative   55. Tea Tree Oil oxidized Negative   56. Ylang-Ylang oil Negative   57. Amidoamine Negative   58. Amerchol L 101 Negative   59. Benzocaine Negative   60. Benzyl alchohol Negative   61. Benzyl salicylate Negative   62. Chloroxylenol (PCMX) Negative   63. Cocamide DEA Negative   64. Clobetasol-17-propionate Negative   65. Toluene-2,5-Diamine sulfate Negative   66. Ethyl acrylate Negative   67. N-Isopropyl-N-phenyl--4-phenylenediamine (IPPD) Negative   68. Lidocaine  Negative   69. Hydroxyisohexyl 3-Cyclohexene Carboxaldehyde OMIT  70. Sesquiterpene lactone mix Negative   71. 2-n-Octyl-4-isothiazolin-3-one Negative   72. Propyl gallate Negative   73. Polymyxin B sulfate Negative   74. Pramoxine hydrochloride Negative   75. Sodium benzoate Negative   76. Sorbitan oleate Negative   77. Sorbitan sesquioleate Negative   78. Tocopherol Negative   79. BENZALKONIUM CHLORIDE Negative   80. Chlorhexidine digluconate Negative      Assessment and Plan: Andrea Kidd is a 23 y.o. female with: Concern for Contact Dermatitis:  The patient has been provided detailed information regarding the substances she is sensitive to, as well as products containing the substances.  Meticulous avoidance of these substances is recommended. If avoidance is not possible, the  use of barrier creams or lotions is recommended. If symptoms persist or progress despite meticulous avoidance of chemicals/substances above, dermatology evaluation may be warranted. No follow-ups on file.  It was my pleasure to see Andrea Kidd today and participate in her care. Please feel free to contact me with any questions or concerns.  Sincerely,   Arleta Blanch, MD [facility] [place]

## 2024-05-31 ENCOUNTER — Ambulatory Visit: Admitting: Internal Medicine

## 2024-05-31 DIAGNOSIS — L253 Unspecified contact dermatitis due to other chemical products: Secondary | ICD-10-CM

## 2024-05-31 NOTE — Patient Instructions (Addendum)
 Positive to: Ammonium persulfate Cobalt(II) chloride hexahydrate 2-Hydroxyethyl methacrylate Hydroperoxides of Linalool 1,3-Diphenylguanidine 29. Nickel(II) sulfate hexahydrate 33. Sodium Metabisulfite Propolis

## 2024-05-31 NOTE — Progress Notes (Addendum)
 Follow Up Note  RE: Andrea Kidd MRN: [id number] DOB: 12/21/2000 Date of Office Visit: 05/31/2024  Referring provider: Tobie Suzzane POUR, MD Primary care provider: Tobie Suzzane POUR, MD  History of Present Illness: I had the pleasure of seeing Andrea Kidd for a follow up visit at the [facility] on 05/31/2024. She is a 23 y.o. female, who is being followed for contact dermatitis. Today she is here for final patch test interpretation, given suspected history of contact dermatitis.   Diagnostics:   NAC 80 96-hour hour reading:  NAC Panel Tested NAC-80 (1-80)   1. Ammonium persulfate 2  2. [place] Balsam Negative   3. BENZISOTHIAZOLINONE OMIT  4. 4-tert-Butylphenolformaldehyde resin (PTBP) Negative   5. Bacitracin Negative   6. Budesonide Negative   7. Quaternium-15 Negative   8. Cinnamal Negative   9. Cobalt(II) chloride hexahydrate 3  10. Colophonium Negative   11. Methyldibromo glutaronitrile Negative   12. Decyl Glucoside Negative   13. Ethylenediamine dihydrochloride Negative   14. 2-Hydroxyethyl methacrylate +/-  15. Hydroperoxides of Linalool +/-  16. Iodopropynyl butylcarbamate Negative   17. 2-Mercaptobenzothiazole (MBT) Negative   18. Thiuram mix Negative   19. METHYLISOTHIAZOLINONE Negative   20. Propylene glycol Negative   21. 1,3-Diphenylguanidine Negative   22. Hydroperoxides of Limonene 2+   23. Black rubber mix Negative   24. Carba mix Negative   25. Fragrance mix I Negative   26. Fragrance mix II Negative   27. Textile dye mix II Negative   28. Neomycin sulfate Negative   29. Nickel(II) sulfate hexahydrate 2+  30. p-Phenylenediamine (PPD) Negative   31. Potassium dichromate Negative   32. Propolis Negative   33. Sodium Metabisulfite 2+  34. Tixocortol-21-pivalate Negative   35. Lanolin alcohol Negative   36. Methylisothiazolinone + Methylchloroisothiazolinone Negative   37. Cocamidopropyl betaine Negative   38.  3-(Dimethylamino)-1-propylamine Negative   39. Formaldehyde Negative  40. Oleamidopropyl dimethylamine Negative   41. 2-Bromo-2-Nitropropane-l,3-diol Negative   42. Diazolidinyl urea Negative  43. DMDM Hydantoin Negative   44. Epoxy resin, Bisphenol A Negative   45. Benzophenone-4 Negative   46. Imidazolidinyl urea Negative   47. Lauryl polyglucose Negative  48 Methyl methacrylate Negative   49. Paraben mix Negative   50. Mercapto mix Negative   51. Caine mix III Negative   52. Mixed dialkyl thiourea Negative   53. Compositae mix II Negative   54. Toluenesulfonamide formaldehyde resin Negative   55. Tea Tree Oil oxidized Negative   56. Ylang-Ylang oil Negative   57. Amidoamine Negative   58. Amerchol L 101 Negative   59. Benzocaine Negative   60. Benzyl alchohol Negative   61. Benzyl salicylate Negative   62. Chloroxylenol (PCMX) Negative   63. Cocamide DEA Negative   64. Clobetasol-17-propionate Negative   65. Toluene-2,5-Diamine sulfate Negative   66. Ethyl acrylate Negative   67. N-Isopropyl-N-phenyl--4-phenylenediamine (IPPD) Negative   68. Lidocaine  Negative   69. Hydroxyisohexyl 3-Cyclohexene Carboxaldehyde OMIT  70. Sesquiterpene lactone mix Negative   71. 2-n-Octyl-4-isothiazolin-3-one Negative   72. Propyl gallate Negative   73. Polymyxin B sulfate Negative   74. Pramoxine hydrochloride Negative   75. Sodium benzoate Negative   76. Sorbitan oleate Negative   77. Sorbitan sesquioleate Negative   78. Tocopherol Negative   79. BENZALKONIUM CHLORIDE Negative   80. Chlorhexidine digluconate Negative      Assessment and Plan: [name] is a 23 y.o. female with: Positive to: Ammonium persulfate Cobalt(II) chloride hexahydrate 2-Hydroxyethyl  methacrylate Hydroperoxides of Linalool 1,3-Diphenylguanidine Nickel(II) sulfate hexahydrate Sodium Metabisulfite Propolis  Discussed strict avoidance and will send safe product list.   Concern for Contact  Dermatitis:  The patient has been provided detailed information regarding the substances she is sensitive to, as well as products containing the substances.  Meticulous avoidance of these substances is recommended. If avoidance is not possible, the use of barrier creams or lotions is recommended. If symptoms persist or progress despite meticulous avoidance of chemicals/substances above, dermatology evaluation may be warranted. No follow-ups on file.  It was my pleasure to see Jermika today and participate in her care. Please feel free to contact me with any questions or concerns.  Sincerely,   Arleta Blanch, MD [facility] [place]

## 2024-05-31 NOTE — Addendum Note (Signed)
 Addended by: Jheremy Boger on: 05/31/2024 04:48 PM   Modules accepted: Orders

## 2024-06-18 ENCOUNTER — Telehealth: Payer: Self-pay

## 2024-06-18 DIAGNOSIS — J4520 Mild intermittent asthma, uncomplicated: Secondary | ICD-10-CM

## 2024-06-18 MED ORDER — MONTELUKAST SODIUM 10 MG PO TABS: 10.0000 mg | ORAL_TABLET | Freq: Every day | ORAL | 3 refills | Status: AC

## 2024-06-18 NOTE — Assessment & Plan Note (Signed)
 Symptoms of chest tightness and shortness of breath likely due to asthma, exacerbated by environmental factors. Current treatment includes inhaler, Zyrtec , Astelin , and Flonase . - Prescribed Singulair  in addition to current medications. - Advised follow-up with Doctor Tobie if symptoms persist in two weeks. - Provided work excuse for November 17th, 2025.

## 2024-06-18 NOTE — Progress Notes (Signed)
   Virtual Visit via Video Note  I connected with Andrea Kidd on 06/18/24 at  3:00 PM EST by a video enabled telemedicine application and verified that I am speaking with the correct person using two identifiers.  Patient Location: Home Provider Location: Office/Clinic  I discussed the limitations, risks, security, and privacy concerns of performing an evaluation and management service by video and the availability of in person appointments. I also discussed with the patient that there may be a patient responsible charge related to this service. The patient expressed understanding and agreed to proceed.  Subjective: PCP: Tobie Suzzane POUR, MD  Chief Complaint  Patient presents with   Medical Management of Chronic Issues    Pt states her asthma has been acting up    HPI   ROS: Per HPI  Current Outpatient Medications:    adapalene  (DIFFERIN ) 0.1 % cream, Apply topically at bedtime., Disp: 45 g, Rfl: 0   albuterol  (VENTOLIN  HFA) 108 (90 Base) MCG/ACT inhaler, Inhale 1-2 puffs into the lungs every 6 (six) hours as needed for shortness of breath or wheezing., Disp: 8 g, Rfl: 1   azelastine  (ASTELIN ) 0.1 % nasal spray, Place 2 sprays into both nostrils 2 (two) times daily as needed for allergies. Use in each nostril as directed, Disp: 30 mL, Rfl: 5   cetirizine  (ZYRTEC ) 10 MG tablet, Take 1 tablet (10 mg total) by mouth daily as needed for allergies., Disp: 30 tablet, Rfl: 5   Drospirenone-Estetrol (NEXTSTELLIS ) 3-14.2 MG TABS, Take 1 tablet by mouth daily., Disp: 90 tablet, Rfl: 3   fluticasone  (FLONASE ) 50 MCG/ACT nasal spray, Place 2 sprays into both nostrils daily., Disp: 16 g, Rfl: 5   hydrocortisone  2.5 % cream, Apply twice daily for flare ups above neck, maximum 7 days., Disp: 30 g, Rfl: 5   montelukast  (SINGULAIR ) 10 MG tablet, Take 1 tablet (10 mg total) by mouth at bedtime., Disp: 30 tablet, Rfl: 3   triamcinolone  ointment (KENALOG ) 0.1 %, Apply twice daily for flare ups below  neck, maximum 10 days., Disp: 80 g, Rfl: 5  Observations/Objective: There were no vitals filed for this visit. Physical Exam  Assessment and Plan: Mild intermittent asthma, uncomplicated Assessment & Plan: Symptoms of chest tightness and shortness of breath likely due to asthma, exacerbated by environmental factors. Current treatment includes inhaler, Zyrtec , Astelin , and Flonase . - Prescribed Singulair  in addition to current medications. - Advised follow-up with Doctor Tobie if symptoms persist in two weeks. - Provided work excuse for November 17th, 2025.  Orders: -     Montelukast  Sodium; Take 1 tablet (10 mg total) by mouth at bedtime.  Dispense: 30 tablet; Refill: 3    Follow Up Instructions: Return for as needed.  Assessment and Plan         I discussed the assessment and treatment plan with the patient. The patient was provided an opportunity to ask questions, and all were answered. The patient agreed with the plan and demonstrated an understanding of the instructions.   The patient was advised to call back or seek an in-person evaluation if the symptoms worsen or if the condition fails to improve as anticipated.  The above assessment and management plan was discussed with the patient. The patient verbalized understanding of and has agreed to the management plan.   Leita Longs, FNP

## 2024-08-13 ENCOUNTER — Ambulatory Visit: Admitting: Internal Medicine

## 2024-08-21 NOTE — Progress Notes (Signed)
" ° °  [address] [place] [address] Dept: [phone]  FOLLOW UP NOTE  Patient ID: Andrea Kidd, female    DOB: Jun 18, 2001  Age: 24 y.o. MRN: [id number] Date of Office Visit: 08/22/2024  Assessment  Chief Complaint: No chief complaint on file.  HPI Andrea Kidd is a 24 year old female who presents to the clinic for follow-up visit.  She was last seen in this clinic on 05/07/2024 by Dr. Tobie for evaluation of asthma, allergic rhinitis, atopic dermatitis, and allergic contact dermatitis.  Her last environmental allergy  skin testing 07/2023 was positive to weed pollen, tree pollen, mold, dust mite, cat, and horse.  Her last chemical patch testing reading on 05/31/2024 was positive to Ammonium persulfate,Cobalt(II) chloride hexahydrate, 2-Hydroxyethyl methacrylate, Hydroperoxides of Linalool, 1,3-Diphenylguanidine, Nickel(II) sulfate hexahydrate, Sodium Metabisulfite, and Propolis.  Discussed the use of AI scribe software for clinical note transcription with the patient, who gave verbal consent to proceed.  History of Present Illness    Child  Drug Allergies:  Allergies[1]  Physical Exam: There were no vitals taken for this visit.   Physical Exam  Diagnostics:    Assessment and Plan: No diagnosis found.  No orders of the defined types were placed in this encounter.   There are no Patient Instructions on file for this visit.  No follow-ups on file.    Thank you for the opportunity to care for this patient.  Please do not hesitate to contact me with questions.  Arlean Mutter, FNP [facility]          [1]  Allergies Allergen Reactions   Bee Pollen    Horse-Derived Products Hives and Other (See Comments)    Cats, cashews, all trees and grass, chlorine-sinus symptoms   "

## 2024-08-21 NOTE — Patient Instructions (Signed)
 Asthma Continue albuterol  2 puffs once every 4 hours if needed for cough or wheeze You may use albuterol  2 puffs 5-15 minutes before activity to decrease cough or wheeze   Allergic rhinitis Continue allergen avoidance measures directed toward weed pollen, tree pollen, mold, dust mite, cat, and horse as listed below Who continue cetirizine  10 mg once a day if needed for runny nose or itch.  You may take an additional dose of cetirizine  10 mg once a day if needed for breakthrough symptoms Continue azelastine  2 sprays in each nostril up to twice a day if needed for runny nose or nasal itch Consider saline nasal rinses as needed for nasal symptoms. Use this before any medicated nasal sprays for best result Consider allergen immunotherapy if your symptoms are not well-controlled with the treatment plan as listed above  Atopic dermatitis Previous food testing was negative - Do a daily soaking tub bath in warm water for 10-15 minutes.  - Use a gentle, unscented cleanser at the end of the bath (such as Dove unscented bar or baby wash, or Aveeno sensitive body wash). Then rinse, pat half-way dry, and apply a gentle, unscented moisturizer cream or ointment (Cerave, Cetaphil, Eucerin, Aveeno, Vaseline, Aquaphor)  all over while still damp. Dry skin makes the itching and rash of eczema worse. The skin should be moisturized with a gentle, unscented moisturizer at least twice daily.  - Use only unscented liquid laundry detergent. - Begin tacolimus to red and itchy areas up to twice a day if needed. This medication does not contain a steroid - Apply prescribed topical steroid (triamcinolone  0.1% below neck or hydrocortisone  2.5% above neck) to flared areas (red and thickened eczema) after the moisturizer has soaked into the skin (wait at least 30 minutes). Taper off the topical steroids as the skin improves. Do not use topical steroid for more than 7-10 days at a time.   Allergic contact dermatitis Continue to  avoid products containing  Ammonium persulfate,Cobalt(II) chloride hexahydrate, 2-Hydroxyethyl methacrylate, Hydroperoxides of Linalool, 1,3-Diphenylguanidine, Nickel(II) sulfate hexahydrate, Sodium Metabisulfite, and Propolis.  Call the clinic if this treatment plan is not working well for you.  Follow up in 6 months or sooner if needed.  Reducing Pollen Exposure The American Academy of Allergy , Asthma and Immunology suggests the following steps to reduce your exposure to pollen during allergy  seasons. Do not hang sheets or clothing out to dry; pollen may collect on these items. Do not mow lawns or spend time around freshly cut grass; mowing stirs up pollen. Keep windows closed at night.  Keep car windows closed while driving. Minimize morning activities outdoors, a time when pollen counts are usually at their highest. Stay indoors as much as possible when pollen counts or humidity is high and on windy days when pollen tends to remain in the air longer. Use air conditioning when possible.  Many air conditioners have filters that trap the pollen spores. Use a HEPA room air filter to remove pollen form the indoor air you breathe.  Control of Mold Allergen Mold and fungi can grow on a variety of surfaces provided certain temperature and moisture conditions exist.  Outdoor molds grow on plants, decaying vegetation and soil.  The major outdoor mold, Alternaria and Cladosporium, are found in very high numbers during hot and dry conditions.  Generally, a late Summer - Fall peak is seen for common outdoor fungal spores.  Rain will temporarily lower outdoor mold spore count, but counts rise rapidly when the rainy period ends.  The most important indoor molds are Aspergillus and Penicillium.  Dark, humid and poorly ventilated basements are ideal sites for mold growth.  The next most common sites of mold growth are the bathroom and the kitchen.  Outdoor [employer] Use air conditioning and keep windows  closed Avoid exposure to decaying vegetation. Avoid leaf raking. Avoid grain handling. Consider wearing a face mask if working in moldy areas.  Indoor Mold Control Maintain humidity below 50%. Clean washable surfaces with 5% bleach solution. Remove sources e.g. Contaminated carpets.   Control of Dust Mite Allergen Dust mites play a major role in allergic asthma and rhinitis. They occur in environments with high humidity wherever human skin is found. Dust mites absorb humidity from the atmosphere (ie, they do not drink) and feed on organic matter (including shed human and animal skin). Dust mites are a microscopic type of insect that you cannot see with the naked eye. High levels of dust mites have been detected from mattresses, pillows, carpets, upholstered furniture, bed covers, clothes, soft toys and any woven material. The principal allergen of the dust mite is found in its feces. A gram of dust may contain 1,000 mites and 250,000 fecal particles. Mite antigen is easily measured in the air during house cleaning activities. Dust mites do not bite and do not cause harm to humans, other than by triggering allergies/asthma.  Ways to decrease your exposure to dust mites in your home:  1. Encase mattresses, box springs and pillows with a mite-impermeable barrier or cover  2. Wash sheets, blankets and drapes weekly in hot water (130 F) with detergent and dry them in a dryer on the hot setting.  3. Have the room cleaned frequently with a vacuum cleaner and a damp dust-mop. For carpeting or rugs, vacuuming with a vacuum cleaner equipped with a high-efficiency particulate air (HEPA) filter. The dust mite allergic individual should not be in a room which is being cleaned and should wait 1 hour after cleaning before going into the room.  4. Do not sleep on upholstered furniture (eg, couches).  5. If possible removing carpeting, upholstered furniture and drapery from the home is ideal. Horizontal  blinds should be eliminated in the rooms where the person spends the most time (bedroom, study, television room). Washable vinyl, roller-type shades are optimal.  6. Remove all non-washable stuffed toys from the bedroom. Wash stuffed toys weekly like sheets and blankets above.  7. Reduce indoor humidity to less than 50%. Inexpensive humidity monitors can be purchased at most hardware stores. Do not use a humidifier as can make the problem worse and are not recommended.  Control of Dog or Cat Allergen Avoidance is the best way to manage a dog or cat allergy . If you have a dog or cat and are allergic to dog or cats, consider removing the dog or cat from the home. If you have a dog or cat but dont want to find it a new home, or if your family wants a pet even though someone in the household is allergic, here are some strategies that may help keep symptoms at bay:  Keep the pet out of your bedroom and restrict it to only a few rooms. Be advised that keeping the dog or cat in only one room will not limit the allergens to that room. Dont pet, hug or kiss the dog or cat; if you do, wash your hands with soap and water. High-efficiency particulate air (HEPA) cleaners run continuously in a bedroom or living  room can reduce allergen levels over time. Regular use of a high-efficiency vacuum cleaner or a central vacuum can reduce allergen levels. Giving your dog or cat a bath at least once a week can reduce airborne allergen.

## 2024-08-22 ENCOUNTER — Encounter: Payer: Self-pay | Admitting: Family Medicine

## 2024-08-22 ENCOUNTER — Other Ambulatory Visit: Payer: Self-pay

## 2024-08-22 ENCOUNTER — Ambulatory Visit: Admitting: Family Medicine

## 2024-08-22 VITALS — BP 104/70 | HR 71 | Temp 98.7°F | Ht 63.0 in | Wt 122.7 lb

## 2024-08-22 DIAGNOSIS — J4520 Mild intermittent asthma, uncomplicated: Secondary | ICD-10-CM | POA: Diagnosis not present

## 2024-08-22 DIAGNOSIS — L253 Unspecified contact dermatitis due to other chemical products: Secondary | ICD-10-CM

## 2024-08-22 DIAGNOSIS — J3089 Other allergic rhinitis: Secondary | ICD-10-CM

## 2024-08-22 DIAGNOSIS — J301 Allergic rhinitis due to pollen: Secondary | ICD-10-CM

## 2024-08-22 DIAGNOSIS — J302 Other seasonal allergic rhinitis: Secondary | ICD-10-CM | POA: Insufficient documentation

## 2024-08-22 DIAGNOSIS — L2084 Intrinsic (allergic) eczema: Secondary | ICD-10-CM | POA: Diagnosis not present

## 2024-08-22 MED ORDER — TACROLIMUS 0.03 % EX OINT: TOPICAL_OINTMENT | Freq: Two times a day (BID) | CUTANEOUS | 0 refills | Status: AC

## 2024-08-27 ENCOUNTER — Telehealth: Payer: Self-pay

## 2024-08-27 NOTE — Telephone Encounter (Signed)
*  AA  Pharmacy Patient Advocate Encounter   Received notification from Fax that prior authorization for Tacrolimus  0.03% is required/requested.   Insurance verification completed.   The patient is insured through [employer].   Per test claim: PA required; PA submitted to above mentioned insurance via Latent Key/confirmation #/EOC [id number] Status is pending

## 2024-08-27 NOTE — Telephone Encounter (Signed)
 Your request has been approved PA Case: [id number], Status: Approved, Coverage Starts on: 08/27/2024 12:00:00 AM, Coverage Ends on: 08/27/2025 12:00:00 AM. Authorization Expiration01/27/2027

## 2024-09-02 ENCOUNTER — Encounter: Payer: Self-pay | Admitting: Internal Medicine

## 2024-09-02 ENCOUNTER — Telehealth: Payer: Medicaid Other | Admitting: Internal Medicine

## 2024-09-02 DIAGNOSIS — E559 Vitamin D deficiency, unspecified: Secondary | ICD-10-CM

## 2024-09-02 DIAGNOSIS — J309 Allergic rhinitis, unspecified: Secondary | ICD-10-CM | POA: Diagnosis not present

## 2024-09-02 DIAGNOSIS — L2084 Intrinsic (allergic) eczema: Secondary | ICD-10-CM

## 2024-09-02 DIAGNOSIS — Z7185 Encounter for immunization safety counseling: Secondary | ICD-10-CM | POA: Diagnosis not present

## 2024-09-02 DIAGNOSIS — E538 Deficiency of other specified B group vitamins: Secondary | ICD-10-CM

## 2024-09-02 NOTE — Assessment & Plan Note (Signed)
 Overall well-controlled Kenalog  cream PRN

## 2024-09-02 NOTE — Progress Notes (Signed)
 "    Virtual Visit via Video Note   Because of Andrea Kidd's co-morbid illnesses, she is at least at moderate risk for complications without adequate follow up.  This format is felt to be most appropriate for this patient at this time.  All issues noted in this document were discussed and addressed.  A limited physical exam was performed with this format.      Evaluation Performed:  Follow-up visit  Date:  09/02/2024   ID:  Andrea Kidd, DOB 12-09-2000, MRN [id number]  Patient Location: Home Provider Location: Home Office  Participants: Patient Location of Patient: Home Location of Provider: Telehealth Consent was obtain for visit to be over via telehealth. I verified that I am speaking with the correct person using two identifiers.  PCP:  Tobie Suzzane POUR, MD   Chief Complaint: Follow up of chronic medical conditions  History of Present Illness:    ZYKIRA Kidd is a 24 y.o. female with PMH of allergic rhinitis, asthma and eczema who has a video visit for f/u of her chronic medical conditions.  She has been evaluated by allergy  and immunology clinic for possible angioedema-like reaction.  Her allergy  testing was positive for some environmental agents (weeds, trees, molds, dust mites, cats, horses).  She has started taking Singulair  and PRN Zyrtec .  Denies any recent episode of lip swelling.  She uses Flonase  for allergic rhinitis.  She reports improvement in her menstrual bleeding now without OCPs now.  Denies any vaginal discharge.  She has history of eczema and uses steroid cream over it.  The patient does not have symptoms concerning for COVID-19 infection (fever, chills, cough, or new shortness of breath).   Past Medical, Surgical, Social History, Allergies, and Medications have been Reviewed.  Past Medical History:  Diagnosis Date   Acne    Allergy     Asthma    Eczema 01/28/2013   No past surgical history on file.   Active Medications[1]    Allergies:   Bee pollen and Horse-derived products   ROS:   Please see the history of present illness. All other systems reviewed and are negative.   Labs/Other Tests and Data Reviewed:    Recent Labs: No results found for requested labs within last 365 days.   Recent Lipid Panel No results found for: CHOL, TRIG, HDL, CHOLHDL, LDLCALC, LDLDIRECT  Wt Readings from Last 3 Encounters:  08/22/24 122 lb 11.2 oz (55.7 kg)  05/07/24 126 lb 9.6 oz (57.4 kg)  11/20/23 117 lb 8 oz (53.3 kg)    Objective:    Vital Signs:  There were no vitals taken for this visit.   VITAL SIGNS:  reviewed GEN:  no acute distress EYES:  sclerae anicteric, EOMI - Extraocular Movements Intact RESPIRATORY:  normal respiratory effort, symmetric expansion NEURO:  alert and oriented x 3, no obvious focal deficit PSYCH:  normal affect  ASSESSMENT & PLAN:    Problem List Items Addressed This Visit       Respiratory   Allergic rhinitis - Primary   Well controlled with Singulair  On Zyrtec  PRN Azelastine  as needed Has been evaluated by Allergy  and immunology clinic      Relevant Orders   CBC with Differential/Platelet   CMP14+EGFR     Musculoskeletal and Integument   Intrinsic atopic dermatitis   Overall well-controlled Kenalog  cream PRN      Relevant Orders   CBC with Differential/Platelet   CMP14+EGFR     Other   Vitamin D   deficiency   Last vitamin D  Lab Results  Component Value Date   VD25OH 14.2 (L) 08/28/2023   Advised to take vitamin D  5000 IU QD      Relevant Orders   Vitamin D  (25 hydroxy)   Vaccine counseling   Refused influenza vaccine. Advised to get Tdap and HPV vaccines at our office.      Other Visit Diagnoses       B12 deficiency       Relevant Orders   B12        I discussed the assessment and treatment plan with the patient. The patient was provided an opportunity to ask questions, and all were answered. The patient agreed with the plan  and demonstrated an understanding of the instructions.   The patient was advised to call back or seek an in-person evaluation if the symptoms worsen or if the condition fails to improve as anticipated.  The above assessment and management plan was discussed with the patient. The patient verbalized understanding of and has agreed to the management plan.   Medication Adjustments/Labs and Tests Ordered: Current medicines are reviewed at length with the patient today.  Concerns regarding medicines are outlined above.   Tests Ordered: Orders Placed This Encounter  Procedures   CBC with Differential/Platelet   CMP14+EGFR   Vitamin D  (25 hydroxy)   B12    Medication Changes: No orders of the defined types were placed in this encounter.    Note: This dictation was prepared with Dragon dictation along with smaller phrase technology. Similar sounding words can be transcribed inadequately or may not be corrected upon review. Any transcriptional errors that result from this process are unintentional.      Disposition:  Follow up  Signed, Suzzane MARLA Blanch, MD  09/02/2024 10:19 AM     [facility]    [1]  Current Meds  Medication Sig   adapalene  (DIFFERIN ) 0.1 % cream Apply topically at bedtime.   albuterol  (VENTOLIN  HFA) 108 (90 Base) MCG/ACT inhaler Inhale 1-2 puffs into the lungs every 6 (six) hours as needed for shortness of breath or wheezing.   azelastine  (ASTELIN ) 0.1 % nasal spray Place 2 sprays into both nostrils 2 (two) times daily as needed for allergies. Use in each nostril as directed   cetirizine  (ZYRTEC ) 10 MG tablet Take 1 tablet (10 mg total) by mouth daily as needed for allergies.   fluticasone  (FLONASE ) 50 MCG/ACT nasal spray Place 2 sprays into both nostrils daily.   hydrocortisone  2.5 % cream Apply twice daily for flare ups above neck, maximum 7 days.   montelukast  (SINGULAIR ) 10 MG tablet Take 1 tablet (10 mg total) by mouth at  bedtime.   tacrolimus  (PROTOPIC ) 0.03 % ointment Apply topically 2 (two) times daily.   triamcinolone  ointment (KENALOG ) 0.1 % Apply twice daily for flare ups below neck, maximum 10 days.   "

## 2024-11-19 ENCOUNTER — Ambulatory Visit: Admitting: Adult Health

## 2025-02-26 ENCOUNTER — Ambulatory Visit: Admitting: Allergy & Immunology
